# Patient Record
Sex: Female | Born: 1945 | Race: Black or African American | Hispanic: No | Marital: Married | Smoking: Current every day smoker
Health system: Southern US, Community
[De-identification: ages and names within clinical notes are randomized; demographics above are authoritative.]

## PROBLEM LIST (undated history)

## (undated) DIAGNOSIS — E119 Type 2 diabetes mellitus without complications: Secondary | ICD-10-CM

## (undated) DIAGNOSIS — I10 Essential (primary) hypertension: Secondary | ICD-10-CM

---

## 1998-08-30 ENCOUNTER — Emergency Department (HOSPITAL_COMMUNITY): Admission: EM | Admit: 1998-08-30 | Discharge: 1998-08-30 | Payer: Self-pay | Admitting: Emergency Medicine

## 1999-03-03 ENCOUNTER — Encounter: Payer: Self-pay | Admitting: Emergency Medicine

## 1999-03-03 ENCOUNTER — Emergency Department (HOSPITAL_COMMUNITY): Admission: EM | Admit: 1999-03-03 | Discharge: 1999-03-03 | Payer: Self-pay | Admitting: Emergency Medicine

## 1999-03-24 ENCOUNTER — Emergency Department (HOSPITAL_COMMUNITY): Admission: EM | Admit: 1999-03-24 | Discharge: 1999-03-24 | Payer: Self-pay | Admitting: *Deleted

## 1999-11-04 ENCOUNTER — Ambulatory Visit (HOSPITAL_COMMUNITY)
Admission: RE | Admit: 1999-11-04 | Discharge: 1999-11-04 | Payer: Self-pay | Admitting: Physical Medicine and Rehabilitation

## 1999-11-04 ENCOUNTER — Encounter: Payer: Self-pay | Admitting: Physical Medicine and Rehabilitation

## 2001-12-15 ENCOUNTER — Emergency Department (HOSPITAL_COMMUNITY): Admission: EM | Admit: 2001-12-15 | Discharge: 2001-12-15 | Payer: Self-pay | Admitting: Emergency Medicine

## 2002-11-13 ENCOUNTER — Emergency Department (HOSPITAL_COMMUNITY): Admission: EM | Admit: 2002-11-13 | Discharge: 2002-11-13 | Payer: Self-pay | Admitting: Emergency Medicine

## 2002-11-13 ENCOUNTER — Encounter: Payer: Self-pay | Admitting: Emergency Medicine

## 2003-01-07 ENCOUNTER — Emergency Department (HOSPITAL_COMMUNITY): Admission: EM | Admit: 2003-01-07 | Discharge: 2003-01-07 | Payer: Self-pay | Admitting: Emergency Medicine

## 2003-01-07 ENCOUNTER — Encounter: Payer: Self-pay | Admitting: Emergency Medicine

## 2004-05-06 ENCOUNTER — Emergency Department (HOSPITAL_COMMUNITY): Admission: EM | Admit: 2004-05-06 | Discharge: 2004-05-06 | Payer: Self-pay | Admitting: Emergency Medicine

## 2009-05-24 ENCOUNTER — Emergency Department (HOSPITAL_COMMUNITY): Admission: EM | Admit: 2009-05-24 | Discharge: 2009-05-24 | Payer: Self-pay | Admitting: Family Medicine

## 2010-12-07 ENCOUNTER — Emergency Department (HOSPITAL_COMMUNITY)
Admission: EM | Admit: 2010-12-07 | Discharge: 2010-12-08 | Disposition: A | Discharge status: Home / Self Care | Payer: Self-pay | Attending: Emergency Medicine | Admitting: Emergency Medicine

## 2010-12-07 DIAGNOSIS — R0602 Shortness of breath: Secondary | ICD-10-CM | POA: Insufficient documentation

## 2010-12-07 DIAGNOSIS — R61 Generalized hyperhidrosis: Secondary | ICD-10-CM | POA: Insufficient documentation

## 2010-12-07 DIAGNOSIS — R42 Dizziness and giddiness: Secondary | ICD-10-CM | POA: Insufficient documentation

## 2010-12-07 DIAGNOSIS — F17200 Nicotine dependence, unspecified, uncomplicated: Secondary | ICD-10-CM | POA: Insufficient documentation

## 2010-12-07 DIAGNOSIS — R0789 Other chest pain: Secondary | ICD-10-CM | POA: Insufficient documentation

## 2010-12-07 DIAGNOSIS — R110 Nausea: Secondary | ICD-10-CM | POA: Insufficient documentation

## 2010-12-08 ENCOUNTER — Emergency Department (HOSPITAL_COMMUNITY): Payer: Self-pay

## 2010-12-08 LAB — CBC
HCT: 40.1 % (ref 36.0–46.0)
Hemoglobin: 14.3 g/dL (ref 12.0–15.0)
MCV: 88.1 fL (ref 78.0–100.0)
RBC: 4.55 MIL/uL (ref 3.87–5.11)
WBC: 7.6 10*3/uL (ref 4.0–10.5)

## 2010-12-08 LAB — URINALYSIS, ROUTINE W REFLEX MICROSCOPIC
Glucose, UA: NEGATIVE mg/dL
Hgb urine dipstick: NEGATIVE
Ketones, ur: 15 mg/dL — AB
Protein, ur: NEGATIVE mg/dL
pH: 5 (ref 5.0–8.0)

## 2010-12-08 LAB — BASIC METABOLIC PANEL
BUN: 13 mg/dL (ref 6–23)
Chloride: 108 mEq/L (ref 96–112)
GFR calc non Af Amer: >60 mL/min (ref >60)
Glucose, Bld: 126 mg/dL — ABNORMAL HIGH (ref 70–99)
Potassium: 3.4 mEq/L — ABNORMAL LOW (ref 3.5–5.1)
Sodium: 143 mEq/L (ref 135–145)

## 2010-12-18 ENCOUNTER — Encounter: Payer: Self-pay | Admitting: Cardiology

## 2010-12-23 ENCOUNTER — Encounter: Payer: Self-pay | Admitting: Cardiology

## 2010-12-23 ENCOUNTER — Ambulatory Visit (INDEPENDENT_AMBULATORY_CARE_PROVIDER_SITE_OTHER): Payer: Medicare Other | Admitting: Cardiology

## 2010-12-23 VITALS — BP 154/76 | HR 90 | Resp 12 | Ht 61.0 in | Wt 149.0 lb

## 2010-12-23 DIAGNOSIS — I10 Essential (primary) hypertension: Secondary | ICD-10-CM

## 2010-12-23 DIAGNOSIS — R079 Chest pain, unspecified: Secondary | ICD-10-CM | POA: Insufficient documentation

## 2010-12-23 DIAGNOSIS — R072 Precordial pain: Secondary | ICD-10-CM

## 2010-12-23 DIAGNOSIS — F17200 Nicotine dependence, unspecified, uncomplicated: Secondary | ICD-10-CM

## 2010-12-23 DIAGNOSIS — Z720 Tobacco use: Secondary | ICD-10-CM

## 2010-12-23 NOTE — Assessment & Plan Note (Signed)
Patient counseled on discontinuing. 

## 2010-12-23 NOTE — Patient Instructions (Signed)
Your physician recommends that you schedule a follow-up appointment as needed with Dr. Jens Som Your physician has requested that you have a stress echocardiogram. For further information please visit [email or web address] Please follow instruction sheet as given.

## 2010-12-23 NOTE — Progress Notes (Signed)
HPI: 65 yo female with no prior cardiac history for evaluation of chest pain and dyspnea. Seen in the emergency room on Dec 08, 2010 with chest pain. Chest x-ray negative. Hemoglobin normal. No cardiac markers drawn. Patient states that on the day above she was working and developed sudden onset of weakness and diaphoresis. There was no shortness of breath or palpitations. There was no syncope. She sat down and developed a "deep ache" in her left chest. The pain did not radiate. It was not pleuritic or positional. She was given aspirin and then had nausea and vomiting. The pain resolved after 5-10 minutes. She has had no symptoms since then. She denies dyspnea on exertion, orthopnea, PND, pedal edema, exertional chest pain or claudication. Because of the above we were asked to further evaluate.  Current Outpatient Prescriptions  Medication Sig Dispense Refill  . Multiple Vitamin (MULTIVITAMIN) capsule Take 1 capsule by mouth every other day.          No Known Allergies  No past medical history on file.  Past Surgical History  Procedure Date  . Cesarean section     History   Social History  . Marital Status: Married    Spouse Name: N/A    Number of Children: 6  . Years of Education: N/A   Occupational History  .  [employer]   Social History Main Topics  . Smoking status: Current Everyday Smoker    Types: Cigarettes  . Smokeless tobacco: Not on file  . Alcohol Use: Not on file  . Drug Use: Not on file  . Sexually Active: Not on file   Other Topics Concern  . Not on file   Social History Narrative  . No narrative on file    Family History  Problem Relation Age of Onset  . Coronary artery disease Mother     Died at age 37 of MI  . Coronary artery disease Father     Age 65    ROS: no fevers or chills, productive cough, hemoptysis, dysphasia, odynophagia, melena, hematochezia, dysuria, hematuria, rash, seizure activity, orthopnea, PND, pedal edema,  claudication. Remaining systems are negative.  Physical Exam: General:  Well developed/well nourished in NAD Skin warm/dry Patient not depressed No peripheral clubbing Back-normal HEENT-normal/normal eyelids Neck supple/normal carotid upstroke bilaterally; no bruits; no JVD; no thyromegaly chest - CTA/ normal expansion CV - RRR/normal S1 and S2; no murmurs, rubs or gallops;  PMI nondisplaced Abdomen -NT/ND, no HSM, no mass, + bowel sounds, no bruit 2+ femoral pulses, no bruits Ext-no edema, chords, 2+ DP Neuro-grossly nonfocal  ECG 12/08/10 - Sinus rhythm at a rate of 63. No significant ST changes.

## 2010-12-23 NOTE — Assessment & Plan Note (Signed)
Blood pressure is mildly elevated today. He does not carry the diagnosis of hypertension. I have asked her to followup with her primary care physician and she may require therapy in the future.

## 2010-12-23 NOTE — Assessment & Plan Note (Signed)
Symptoms atypical. We'll schedule stress echocardiogram for risk stratification.

## 2011-01-08 ENCOUNTER — Ambulatory Visit (HOSPITAL_BASED_OUTPATIENT_CLINIC_OR_DEPARTMENT_OTHER): Payer: Medicare Other | Admitting: Radiology

## 2011-01-08 ENCOUNTER — Ambulatory Visit (HOSPITAL_COMMUNITY): Payer: Medicare Other | Attending: Cardiology | Admitting: Radiology

## 2011-01-08 DIAGNOSIS — R0989 Other specified symptoms and signs involving the circulatory and respiratory systems: Secondary | ICD-10-CM

## 2011-01-08 DIAGNOSIS — R072 Precordial pain: Secondary | ICD-10-CM

## 2011-01-08 DIAGNOSIS — R5381 Other malaise: Secondary | ICD-10-CM | POA: Insufficient documentation

## 2011-01-08 DIAGNOSIS — F17200 Nicotine dependence, unspecified, uncomplicated: Secondary | ICD-10-CM | POA: Insufficient documentation

## 2011-01-08 DIAGNOSIS — R5383 Other fatigue: Secondary | ICD-10-CM | POA: Insufficient documentation

## 2011-01-08 DIAGNOSIS — I10 Essential (primary) hypertension: Secondary | ICD-10-CM | POA: Insufficient documentation

## 2011-01-12 ENCOUNTER — Telehealth: Payer: Self-pay | Admitting: Cardiology

## 2011-01-12 NOTE — Telephone Encounter (Signed)
Pt would like test results from last week she was told to call today to get results

## 2011-01-12 NOTE — Telephone Encounter (Signed)
Spoke with pt, aware of test results Carol Ortiz  

## 2011-09-02 ENCOUNTER — Encounter (HOSPITAL_COMMUNITY): Payer: Self-pay | Admitting: *Deleted

## 2011-09-02 ENCOUNTER — Emergency Department (INDEPENDENT_AMBULATORY_CARE_PROVIDER_SITE_OTHER)
Admission: EM | Admit: 2011-09-02 | Discharge: 2011-09-02 | Disposition: A | Discharge status: Home / Self Care | Payer: Medicare Other | Source: Home / Self Care | Attending: Family Medicine | Admitting: Family Medicine

## 2011-09-02 DIAGNOSIS — M25569 Pain in unspecified knee: Secondary | ICD-10-CM

## 2011-09-02 NOTE — ED Provider Notes (Signed)
History     CSN: [id number]  Arrival date & time 09/02/11  1318   First MD Initiated Contact with Patient 09/02/11 1423      Chief Complaint  Patient presents with  . Leg Pain    (Consider location/radiation/quality/duration/timing/severity/associated sxs/prior treatment) HPI Comments: The patient reports having left post knee pain x 1 day. No known injury. No repetative motion. No locking or giving way. Pain increases with weight bearing. States she had been sitting at a computer and had pain when she tried to stand. No tx pta.   The history is provided by the patient.    History reviewed. No pertinent past medical history.  Past Surgical History  Procedure Date  . Cesarean section     Family History  Problem Relation Age of Onset  . Coronary artery disease Mother     Died at age 49 of MI  . Coronary artery disease Father     Age 68    History  Substance Use Topics  . Smoking status: Current Everyday Smoker    Types: Cigarettes  . Smokeless tobacco: Not on file  . Alcohol Use: Not on file    OB History    Grav Para Term Preterm Abortions TAB SAB Ect Mult Living                  Review of Systems  Constitutional: Negative.   HENT: Negative.   Respiratory: Negative.   Cardiovascular: Negative.   Gastrointestinal: Negative.   Genitourinary: Negative.     Allergies  Review of patient's allergies indicates no known allergies.  Home Medications   Current Outpatient Rx  Name Route Sig Dispense Refill  . MULTIVITAMINS PO CAPS Oral Take 1 capsule by mouth every other day.        BP 134/80  Pulse 84  Temp(Src) 98.5 F (36.9 C) (Oral)  Resp 18  SpO2 95%  Physical Exam  Nursing note and vitals reviewed. Constitutional: She appears well-developed and well-nourished. No distress.  Cardiovascular: Normal rate.   Pulmonary/Chest: Breath sounds normal.  Musculoskeletal:       Evaluation of the left knee reveals minimal swelling if any. No increased  warmth or erythema. No skin changes. No joint line or popliteal tenderness or swelling. rom intact. Pain with weight bearing. N/v intact distally. No calf swelling or tenderness. No evidence for dvt.     ED Course  Procedures (including critical care time)  Labs Reviewed - No data to display No results found.   1. Knee pain       MDM          Randa Spike, MD 09/02/11 [phone]

## 2011-09-02 NOTE — ED Notes (Signed)
Pt    Is  Awake  Alert  denys  Any  Chest pain or  Any  Shortness of  Breath      Skin is  Warm  Dry      She  Ambulated  To  Exam  Room

## 2011-09-02 NOTE — ED Notes (Signed)
Pt  Reports  Pain back of l  Leg   From  Thigh  Down to  Calf  She  denys  Any  Injury   Pain on  Weight  Bearing  She  Reports  Feels  Tight  Behind  l  Knee

## 2011-10-15 ENCOUNTER — Encounter (HOSPITAL_COMMUNITY): Payer: Self-pay

## 2011-10-15 ENCOUNTER — Emergency Department (HOSPITAL_COMMUNITY)
Admission: EM | Admit: 2011-10-15 | Discharge: 2011-10-15 | Disposition: A | Discharge status: Home / Self Care | Payer: Medicare Other | Source: Home / Self Care | Attending: Emergency Medicine | Admitting: Emergency Medicine

## 2011-10-15 DIAGNOSIS — M545 Low back pain, unspecified: Secondary | ICD-10-CM

## 2011-10-15 MED ORDER — CYCLOBENZAPRINE HCL 10 MG PO TABS: 10.0000 mg | ORAL_TABLET | Freq: Three times a day (TID) | ORAL | Status: AC | PRN

## 2011-10-15 MED ORDER — MELOXICAM 15 MG PO TABS: 15.0000 mg | ORAL_TABLET | Freq: Every day | ORAL | Status: AC

## 2011-10-15 NOTE — Discharge Instructions (Signed)
Followup with Dr. Ilean Skill as scheduled. Stop the ibuprofen, and start taking the meloxicam instead. You may also take 1 g of Tylenol up to 4 times a day as needed for pain. This in combination with the meloxicam is an effective combination for pain. Use the Flexeril as needed. Be cautious with this medication, as it may make you very sleepy, and increases her risk for fall. You also need to have your blood pressure rechecked by your primary care physician within a week. It was high on today's visit. It is important to keep your blood pressure under good control, as having a elevated for prolonged periods of time significantly increases your risk of stroke, heart attacks, kidney damage, eye damage, and other problems. Return immediately to the ER if you start having chest pain, headache, problems seeing, problems talking, problems walking, if you feel like you're about to pass out, if you do pass out, if you have a seizure, or for any other concerns.Marland Kitchen

## 2011-10-15 NOTE — ED Notes (Signed)
Pt reports she has been having pain in  her low back and into her left hip for past 2 weeks. Had been seen here about a month ago and was given a leg brace to wear, went to see Dr Ilean Skill, and has an appt to see him on Tuesday. States her back pain last night was very bad, off tonight, and is wanting something to help with the pain until she can see her MD next week; NAD at present

## 2011-10-15 NOTE — ED Provider Notes (Signed)
History     CSN: [id number]  Arrival date & time 10/15/11  1528   First MD Initiated Contact with Patient 10/15/11 1624      Chief Complaint  Patient presents with  . Back Pain    (Consider location/radiation/quality/duration/timing/severity/associated sxs/prior treatment) HPI Comments: Patient reports achy, intermittent lower back pain for approximately 2 weeks. Patient states that pain is located in her left hip and left lower back. States that the pain is worse with sitting for prolonged periods of time, and from going from lying to standing. Patient has been taking 600 mg of ibuprofen with mild to moderate relief. She states that the pain started after wearing a knee immobilizer on the left side for several weeks because of knee pain. Her knee pain has since resolved. Patient denies fevers, h/o of recent or remote trauma to her back or hip, neurological deficits, urinary complaints, abdominal pain, bladder/ bowel incontinence, h/o CA, unexplained weight loss, pain worse at night,  h/o prolonged steroid use, h/o osteopenia, h/o IVDU.      Patient is a 66 y.o. female presenting with back pain. No language interpreter was used.  Back Pain  The current episode started more than 1 week ago. The problem occurs daily. The problem has not changed since onset.The pain is present in the lumbar spine. The quality of the pain is described as aching. The symptoms are aggravated by bending and certain positions. The pain is worse during the day. Stiffness is present in the morning. Pertinent negatives include no chest pain, no fever, no abdominal pain and no weakness. She has tried NSAIDs for the symptoms. The treatment provided mild relief.    Past Medical History  Diagnosis Date  . Chest pain     normal echo 2012    Past Surgical History  Procedure Date  . Cesarean section     Family History  Problem Relation Age of Onset  . Coronary artery disease Mother     Died at age 67 of MI  .  Coronary artery disease Father     Age 57    History  Substance Use Topics  . Smoking status: Current Everyday Smoker    Types: Cigarettes  . Smokeless tobacco: Not on file  . Alcohol Use: No    OB History    Grav Para Term Preterm Abortions TAB SAB Ect Mult Living                  Review of Systems  Constitutional: Negative for fever.  Respiratory: Negative for shortness of breath.   Cardiovascular: Negative for chest pain.  Gastrointestinal: Negative for nausea and abdominal pain.  Genitourinary: Negative for flank pain and decreased urine volume.  Musculoskeletal: Positive for back pain and arthralgias. Negative for joint swelling.  Skin: Negative for rash.  Neurological: Negative for weakness.    Allergies  Review of patient's allergies indicates no known allergies.  Home Medications   Current Outpatient Rx  Name Route Sig Dispense Refill  . CYCLOBENZAPRINE HCL 10 MG PO TABS Oral Take 1 tablet (10 mg total) by mouth 3 (three) times daily as needed for muscle spasms. 20 tablet 0  . MELOXICAM 15 MG PO TABS Oral Take 1 tablet (15 mg total) by mouth daily. 14 tablet 0  . MULTIVITAMINS PO CAPS Oral Take 1 capsule by mouth every other day.        BP 161/87  Pulse 64  Temp(Src) 98.4 F (36.9 C) (Oral)  Resp 18  SpO2 100% Filed Vitals:   10/15/11 1730  BP: 160/78  Pulse:   Temp:   Resp:      Physical Exam  Nursing note and vitals reviewed. Constitutional: She is oriented to person, place, and time. She appears well-developed and well-nourished. No distress.  HENT:  Head: Normocephalic and atraumatic.  Eyes: Conjunctivae and EOM are normal.  Neck: Normal range of motion.  Cardiovascular: Normal rate.   Pulmonary/Chest: Effort normal.  Abdominal: She exhibits no distension.  Musculoskeletal: Normal range of motion.       Back:       Bilateral lower extremities nontender, baseline ROM with intact PT pulses. No pain with PROM hips bilaterally. SLR neg  bilaterally. Sensation baseline light touch bilaterally for Pt, DTR's symmetric and intact bilaterally KJ. Motor symmetric bilateral 5/5 hip flexion, quadriceps, hamstrings, EHL, foot dorsiflexion, foot plantarflexion, has mild limp on the left side, but gait is steady. Left Knee exam within normal limits.   Neurological: She is alert and oriented to person, place, and time.  Skin: Skin is warm and dry.  Psychiatric: She has a normal mood and affect. Her behavior is normal. Judgment and thought content normal.    ED Course  Procedures (including critical care time)  Labs Reviewed - No data to display No results found.   1. Back pain, lumbosacral       MDM  Previous chart  reviewed. Pt seen at [facility] for left knee pain in February 2013, was given brace. Seen last year for CP, OP stress test was WNL. Was noted to have elevated BP during the stress test but was not tx'd for HTN. Was normotensive on last visit. Pt hypertensive today.  appears to be in pain. Pt denies any CNS type sx such as HA, visual changes, focal paresis, or new onset seizure activity. Pt denies any CV sx such as CP, dyspnea, palpitations, pedal edema, tearing pain radiating to back or abd. Pt denied any renal sx such as anuria or hematuria. Pt denies illicit drug use, most notably cocaine, or recent use of OTC medications such as nasal decongestants. Discussed with patient importance of having this rechecked at her primary care physician.    H&P most consistent with low back pain from altered gait. Patient states that she was walking around in the knee immobilizer for several weeks, and her pain started while doing this. She has an appointment with Dr. Orvan Falconer at [facility] in 4 days. Will send her home with a long acting anti-inflammatory, and Flexeril.  [place] narcotic database reviewed. Pt with no narcotic rx in past year.   Luiz Blare, MD 10/15/11 [phone]

## 2014-01-07 ENCOUNTER — Emergency Department (INDEPENDENT_AMBULATORY_CARE_PROVIDER_SITE_OTHER): Payer: Medicare Other

## 2014-01-07 ENCOUNTER — Emergency Department (HOSPITAL_COMMUNITY)
Admission: EM | Admit: 2014-01-07 | Discharge: 2014-01-07 | Disposition: A | Discharge status: Home / Self Care | Payer: Medicare Other | Source: Home / Self Care | Attending: Emergency Medicine | Admitting: Emergency Medicine

## 2014-01-07 ENCOUNTER — Encounter (HOSPITAL_COMMUNITY): Payer: Self-pay | Admitting: Emergency Medicine

## 2014-01-07 DIAGNOSIS — M1710 Unilateral primary osteoarthritis, unspecified knee: Secondary | ICD-10-CM

## 2014-01-07 DIAGNOSIS — IMO0002 Reserved for concepts with insufficient information to code with codable children: Secondary | ICD-10-CM

## 2014-01-07 DIAGNOSIS — M76899 Other specified enthesopathies of unspecified lower limb, excluding foot: Secondary | ICD-10-CM

## 2014-01-07 DIAGNOSIS — M1712 Unilateral primary osteoarthritis, left knee: Secondary | ICD-10-CM

## 2014-01-07 DIAGNOSIS — M7071 Other bursitis of hip, right hip: Secondary | ICD-10-CM

## 2014-01-07 MED ORDER — SALSALATE 750 MG PO TABS: 750.0000 mg | ORAL_TABLET | Freq: Two times a day (BID) | ORAL | Status: DC

## 2014-01-07 MED ORDER — HYDROCODONE-ACETAMINOPHEN 5-325 MG PO TABS
ORAL_TABLET | ORAL | Status: DC
Start: 2014-01-07 — End: 2017-09-09

## 2014-01-07 MED ORDER — IBUPROFEN 800 MG PO TABS
ORAL_TABLET | ORAL | Status: AC
  Filled 2014-01-07: qty 1

## 2014-01-07 MED ORDER — OMEPRAZOLE 20 MG PO CPDR: 20.0000 mg | DELAYED_RELEASE_CAPSULE | Freq: Every day | ORAL | Status: DC

## 2014-01-07 MED ORDER — IBUPROFEN 800 MG PO TABS
800.0000 mg | ORAL_TABLET | Freq: Once | ORAL | Status: AC
  Administered 2014-01-07: 800 mg via ORAL

## 2014-01-07 NOTE — ED Notes (Addendum)
Denies injury. Has pain in left knee and right hip. Prior visit to MD was told she has arthritis. Has an appointment to see Dr Zachery DauerBarnes tomorrow. Has used motrin for pain w minimal relief

## 2014-01-07 NOTE — ED Provider Notes (Signed)
Chief Complaint    Chief Complaint  Patient presents with  . 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 She is on her feet long hours every day. She's had a several year long history of recurring pain left knee. She saw her orthopedist for this. She was told she had a cartilage tear and a cyst. She was put in a brace and given Mobic. The knee has gotten better and worse. The pain is localized over the kneecap and in the popliteal fossa. Sometimes locks up and feels weak. She denies any catching or giving way. It hurts to walk or to go up and down steps.  Her new problem is right hip pain. This is been going on for the past week. Is worse when she lies on that side. There's been no injury or trauma. No pain radiating down the leg. No numbness, tingling, weakness, bladder or bowel dysfunction or saddle anesthesia. She's due to see her orthopedist tomorrow.  Review of Systems     Other than as noted above, the patient denies any of the following symptoms: Systemic:  No fevers or chills.   Musculoskeletal:  No joint pain, arthritis, back pain, or neck pain. Neurological:  No muscular weakness or paresthesias.  PMFSH    Past medical history, family history, social history, meds, and allergies were reviewed.    Physical Exam    Vital signs:  BP 155/83  Pulse 74  Temp(Src) 98.2 F (36.8 C) (Oral)  Resp 16  SpO2 96% Gen:  Alert and oriented times 3.  In no distress. Musculoskeletal: Exam of the right hip reveals pain to palpation over the greater trochanter. The hip has 90 of flexion. Beyond that it hurts. She has pain with internal and external rotation. Pearlean BrownieFaber and Fadir maneuvers are positive. Straight leg raising is negative. Exam of the knee reveals pain to palpation of the medial joint line and the popliteal fossa. The knee has a full range of motion with pain with flexion beyond 90. There is no crepitus.  Murray's sign was negative. Lachman's sign was negative, anterior drawer sign was negative. Varus and valgus stress were negative.  Otherwise, all joints had a full a ROM with no swelling, bruising or deformity.  No edema, pulses full. Extremities were warm and pink.  Capillary refill was brisk.  Skin:  Clear, warm and dry.  No rash. Neuro:  Alert and oriented times 3.  Muscle strength was normal.  Sensation was intact to light touch.    Radiology     Dg Hip Complete Right  01/07/2014   CLINICAL DATA:  Pain  EXAM: RIGHT HIP - COMPLETE 2+ VIEW  COMPARISON:  None.  FINDINGS: Frontal pelvis as well as frontal and lateral right hip images were obtained. There is no fracture or dislocation. Joint spaces appear intact. No erosive change. There is a probable bone island in the lateral left ischium.  IMPRESSION: No abnormality noted.   Electronically Signed   By: Bretta BangWilliam  Woodruff M.D.   On: 01/07/2014 13:31   Dg Knee Complete 4 Views Left  01/07/2014   CLINICAL DATA:  LEFT knee pain for years, no injury  EXAM: LEFT KNEE - COMPLETE 4+ VIEW  COMPARISON:  None  FINDINGS: Question mild osseous demineralization.  Medial compartment joint space narrowing and minimal spur formation, questionably lateral compartment as well.  No acute fracture, dislocation  or bone destruction.  No knee joint effusion or regional soft tissue abnormality.  IMPRESSION: Minimal degenerative changes.  No acute abnormalities.   Electronically Signed   By: Mark  BolesUlyses Southward M.D.   On: 01/07/2014 13:32   I reviewed the images independently and personally and concur with the radiologist's findings.  Course in Urgent [facility]   She was given a knee sleeve for the left knee.  Assessment    The primary encounter diagnosis was Osteoarthritis of left knee. A diagnosis of Bursitis of right hip was also pertinent to this visit.  Plan   1.  Meds:  The following meds were prescribed:   Discharge Medication List as of 01/07/2014  1:57 PM     START taking these medications   Details  HYDROcodone-acetaminophen (NORCO/VICODIN) 5-325 MG per tablet 1 to 2 tabs every 4 to 6 hours as needed for pain., Print    omeprazole (PRILOSEC) 20 MG capsule Take 1 capsule (20 mg total) by mouth daily., Starting 01/07/2014, Until Discontinued, Normal    salsalate (DISALCID) 750 MG tablet Take 1 tablet (750 mg total) by mouth 2 (two) times daily., Starting 01/07/2014, Until Discontinued, Normal        2.  Patient Education/Counseling:  The patient was given appropriate handouts, self care instructions, and instructed in symptomatic relief, including rest and activity, elevation, application of ice and compression.  Given exercises for the knee and the hip.  3.  Follow up:  The patient was told to follow up here if no better in 3 to 4 days, or sooner if becoming worse in any way, and given some red flag symptoms such as worsening pain or new neurological symptoms which would prompt immediate return.  Follow up with her orthopedist tomorrow.     Reuben Likesavid C Celsa Nordahl, MD 01/07/14 1600

## 2014-01-07 NOTE — Discharge Instructions (Signed)
Knee pain can be caused by many conditions:  Osteoarthritis, gout, bursitis, tendonitis, cartiledge damage, condromalacia patella, patellofemoral syndrome, and ligament sprain to name just a few.  Often some simple conservative measures can help alleviate the pain.  Do not do the following:  Avoid squatting and doing deep knee bends.  This puts too much of load on your cartiledges and tendons.  If you do a knee bend, go only half way down, flexing your knee no more than 90 degrees.  Do the following:  If you are overweight or obese, lose weight.  This makes for a lot less load on your knee joints.  If you use tobacco, quit.  Nicotine causes spasm of the small arteries, decreases blood flow, and impairs your body's normal ability to repair damage.  If your knee is acutely inflamed, use the principles of RICE (rest, ice, compression, and elevation).  Wearing a knee brace can help.  These are usually made of neoprene and can be purchased over the counter at the drug store.  Use of over the counter pain meds can be of help.  Tylenol (or acetaminophen) is the safest to use.  It often helps to take this regularly.  You can take up to 2 325 mg tablets 5 times daily, but it best to start out much lower that that, perhaps 2 325 mg tablets twice daily, then increase from there. People who are on the blood thinner warfarin have to be careful about taking high doses of Tylenol.  For people who are able to tolerate them, ibuprofen and naproxyn can also help with the pain.  You should discuss these agents with your physician before taking them.  People with chronic kidney disease, hypertension, peptic ulcer disease, and reflux can suffer adverse side effects. They should not be taken with warfarin. The maximum dosage of ibuprofen is 800 mg 3 times daily with meals.  The maximum dosage of naprosyn is 2 and 1/2 tablets twice daily with food, but again, start out low and gradually increase the dose until adequate  pain relief is achieved. Ibuprofen and naprosyn should always be taken with food.  People with cartiledge injury or osteoarthritis may find glucosamine to be helpful.  This is an over-the-counter supplement that helps nourish and repair cartiledge.  The dose is 500 mg 3 times daily or 1500 mg taken in a single dose. This can take several months to work and it doesn't always work.    For people with knee pain on just one side, use of a cane held in the hand on the same side as the knee pain takes some of the stress off the knee joint and can make a big difference in knee pain.  Wearing good shoes with adequate arch support is essential.  Regular exercise is of utmost importance.  Swimming, water aerobics, or use of an elliptical exerciser put the least stress on the knees of any exercise.  Finally doing the exercises below can be very helpful.  They tend to strengthen the muscles around the knee and provide extra support and stability.  Try to do them twice a day followed by ice for 10 minutes.        Most hip pain is caused by osteoarthritis, bursitis, or tendonitis.  Simple measures plus regular gentle exercises can help.  Do not do the following:  Avoid squatting and doing deep knee bends.  This puts too much of load on your cartiledges and tendons.  If you do a  knee bend, go only half way down, flexing your knee no more than 90 degrees.  Avoid sleeping on the side that hurts.  Do the following:  If you are overweight or obese, lose weight.  This makes for a lot less load on your hip joints.  If you use tobacco, quit.  Nicotine causes spasm of the small arteries, decreases blood flow, and impairs your body's normal ability to repair damage.  If your hip is acutely inflamed, use the principles of RICE (rest, ice, compression, and elevation).  Use of over the counter pain meds can be of help.  Tylenol (or acetaminophen) is the safest to use.  It often helps to take this regularly.   You can take up to 2 325 mg tablets 5 times daily, but it best to start out much lower that that, perhaps 2 325 mg tablets twice daily, then increase from there. People who are on the blood thinner warfarin have to be careful about taking high doses of Tylenol.  For people who are able to tolerate them, ibuprofen and naproxyn can also help with the pain.  You should discuss these agents with your physician before taking them.  People with chronic kidney disease, hypertension, peptic ulcer disease, and reflux can suffer adverse side effects. They should not be taken with warfarin. The maximum dosage of ibuprofen is 800 mg 3 times daily with meals.  The maximum dosage of naprosyn is 2 and 1/2 tablets twice daily with food, but again, start out low and gradually increase the dose until adequate pain relief is achieved. Ibuprofen and naprosyn should always be taken with food.  People with cartiledge injury or osteoarthritis may find glucosamine to be helpful.  This is an over-the-counter supplement that helps nourish and repair cartiledge.  The dose is 500 mg 3 times daily or 1500 mg taken in a single dose. This can take several months to work and it doesn't always work.    For people with hip pain on just one side, use of a cane held in the hand on the same side as the hip pain takes some of the stress off the hip joint and can make a big difference in hip pain.  Wearing good shoes with adequate arch support is essential. Use of an orthotic insert can be very helpful.  These can be purchased at a shoe store or inexpensive inserts can be gotten at the drug store.  Regular exercise is of utmost importance.  Swimming, water aerobics, low impact aerobics, yoga or tai chi are helpful.  Use of an elliptical exerciser put the least stress on the hips of any type of exercise machine.  Finally doing the exercises below can be very helpful. Try to do them twice a day followed by ice for 10 minutes.

## 2014-01-22 ENCOUNTER — Encounter: Payer: Self-pay | Admitting: Cardiology

## 2017-09-09 ENCOUNTER — Other Ambulatory Visit: Payer: Self-pay

## 2017-09-09 ENCOUNTER — Encounter (HOSPITAL_COMMUNITY): Payer: Self-pay | Admitting: Emergency Medicine

## 2017-09-09 ENCOUNTER — Ambulatory Visit (HOSPITAL_COMMUNITY)
Admission: EM | Admit: 2017-09-09 | Discharge: 2017-09-09 | Disposition: A | Discharge status: Home / Self Care | Payer: Medicare Other | Attending: Family Medicine | Admitting: Family Medicine

## 2017-09-09 DIAGNOSIS — H6122 Impacted cerumen, left ear: Secondary | ICD-10-CM

## 2017-09-09 NOTE — ED Triage Notes (Signed)
Pt c/o dull L ear pain x3 days. Pt unable to hear out of L ear, feels like its clogged up.

## 2017-09-09 NOTE — ED Provider Notes (Signed)
[facility]   [id number] 09/09/17 Arrival Time: 1146   SUBJECTIVE:  Carol Ortiz is a 71 y.o. female who presents to the urgent care with complaint of dull L ear pain x3 days. Pt unable to hear out of L ear, feels like its clogged up.   Past Medical History:  Diagnosis Date  . Chest pain    normal echo 2012   Family History  Problem Relation Age of Onset  . Coronary artery disease Mother        Died at age 78 of MI  . Coronary artery disease Father        Age 53   Social History   Socioeconomic History  . Marital status: Married    Spouse name: Not on file  . Number of children: 6  . Years of education: Not on file  . Highest education level: Not on file  Social Needs  . Financial resource strain: Not on file  . Food insecurity - worry: Not on file  . Food insecurity - inability: Not on file  . Transportation needs - medical: Not on file  . Transportation needs - non-medical: Not on file  Occupational History    Employer: [employer]  Tobacco Use  . Smoking status: Current Every Day Smoker    Types: Cigarettes  Substance and Sexual Activity  . Alcohol use: No  . Drug use: No  . Sexual activity: Not on file  Other Topics Concern  . Not on file  Social History Narrative  . Not on file   No outpatient medications have been marked as taking for the 09/09/17 encounter (Hospital Encounter).   No Known Allergies    ROS: As per HPI, remainder of ROS negative.   OBJECTIVE:   Vitals:   09/09/17 1249  BP: (!) 161/110  Pulse: 83  Resp: 14  Temp: 98.4 F (36.9 C)  SpO2: 97%     General appearance: alert; no distress Eyes: PERRL; EOMI; conjunctiva normal HENT: normocephalic; atraumatic; left ear cerumen impaction, external ears normal without trauma; nasal mucosa normal; oral mucosa normal Neck: supple Back: no CVA tenderness Extremities: no cyanosis or edema; symmetrical with no gross deformities Skin: warm and  dry Neurologic: normal gait; grossly normal Psychological: alert and cooperative; normal mood and affect      Labs:  Results for orders placed or performed during the hospital encounter of 12/07/10  CBC  Result Value Ref Range   WBC 7.6 4.0 - 10.5 K/uL   RBC 4.55 3.87 - 5.11 MIL/uL   Hemoglobin 14.3 12.0 - 15.0 g/dL   HCT 40.1 36.0 - 46.0 %   MCV 88.1 78.0 - 100.0 fL   MCH 31.4 26.0 - 34.0 pg   MCHC 35.7 30.0 - 36.0 g/dL   RDW 12.6 11.5 - 15.5 %   Platelets 278 150 - 400 K/uL  Basic metabolic panel  Result Value Ref Range   Sodium 143 135 - 145 mEq/L   Potassium 3.4 (L) 3.5 - 5.1 mEq/L   Chloride 108 96 - 112 mEq/L   CO2 25 19 - 32 mEq/L   Glucose, Bld 126 (H) 70 - 99 mg/dL   BUN 13 6 - 23 mg/dL   Creatinine, Ser 0.60 0.4 - 1.2 mg/dL   Calcium 9.5 8.4 - 10.5 mg/dL   GFR calc non Af Amer >60 >60 mL/min   GFR calc Af Amer  >60 mL/min    >60          The eGFR has been calculated using the MDRD equation. This calculation has not been validated in all clinical situations. eGFR's persistently <60 mL/min signify possible Chronic Kidney Disease.  Urinalysis, Routine w reflex microscopic  Result Value Ref Range   Color, Urine YELLOW YELLOW   APPearance CLEAR CLEAR   Specific Gravity, Urine 1.024 1.005 - 1.030   pH 5.0 5.0 - 8.0   Glucose, UA NEGATIVE NEGATIVE mg/dL   Hgb urine dipstick NEGATIVE NEGATIVE   Bilirubin Urine SMALL (A) NEGATIVE   Ketones, ur 15 (A) NEGATIVE mg/dL   Protein, ur NEGATIVE NEGATIVE mg/dL   Urobilinogen, UA 1.0 0.0 - 1.0 mg/dL   Nitrite NEGATIVE NEGATIVE   Leukocytes, UA  NEGATIVE    NEGATIVE MICROSCOPIC NOT DONE ON URINES WITH NEGATIVE PROTEIN, BLOOD, LEUKOCYTES, NITRITE, OR GLUCOSE <1000 mg/dL.    Labs Reviewed - No data to display  No results found.     ASSESSMENT & PLAN:  1. Impacted cerumen of left ear     No orders of the defined types were placed in this encounter.   Reviewed expectations re: course of current medical  issues. Questions answered. Outlined signs and symptoms indicating need for more acute intervention. Patient verbalized understanding. After Visit Summary given.    Procedures:  Left ear irrigation was successful and TM is normal      Robyn Haber, MD 09/09/17 1400

## 2017-09-09 NOTE — Discharge Instructions (Signed)
Cerumenex and Debrox are two brands of drops which help loosen and remove wax.  You could put a drop in once a month to prevent accumulation of the wax  These two medications are over the counter

## 2017-10-29 ENCOUNTER — Other Ambulatory Visit: Payer: Self-pay

## 2017-10-29 ENCOUNTER — Ambulatory Visit (HOSPITAL_COMMUNITY)
Admission: EM | Admit: 2017-10-29 | Discharge: 2017-10-29 | Disposition: A | Discharge status: Home / Self Care | Payer: Medicare Other | Attending: Internal Medicine | Admitting: Internal Medicine

## 2017-10-29 ENCOUNTER — Encounter (HOSPITAL_COMMUNITY): Payer: Self-pay | Admitting: *Deleted

## 2017-10-29 DIAGNOSIS — R030 Elevated blood-pressure reading, without diagnosis of hypertension: Secondary | ICD-10-CM | POA: Diagnosis not present

## 2017-10-29 DIAGNOSIS — Z8679 Personal history of other diseases of the circulatory system: Secondary | ICD-10-CM | POA: Diagnosis not present

## 2017-10-29 NOTE — Discharge Instructions (Signed)
May keep log of blood pressure checks a few times a week, try to take at the same time of the day.  Please establish with a primary care provider for further management. If develop chest pain, headache, vision change, shortness of breath or other concern with elevated blood pressure please return to be seen.

## 2017-10-29 NOTE — ED Provider Notes (Signed)
[facility]    CSN: [id number] Arrival date & time: 10/29/17  1202     History   Chief Complaint Chief Complaint  Patient presents with  . Hypertension    HPI Carol Ortiz is a 72 y.o. female.   Carol Ortiz presents with concerns about her blood pressure. States she has been in urgent cares a few times over the past few weeks and had elevated blood pressure. Was at an urgent care just a few days ago to have work papers filled out and her bp was around 173/93 she thought. She is not on any blood pressure medications, and has not been in the past. She states she has occasional headaches. Denies any symptoms currently including headache, shortness of breath , leg swelling, vision changes. She quit smoking approximately 3 years ago. She has not followed regularly with a PCP and currently does not have one. Takes multivitamins daily otherwise no other medications. No other known medical history.    ROS per HPI.      Past Medical History:  Diagnosis Date  . Chest pain    normal echo 2012    Patient Active Problem List   Diagnosis Date Noted  . Chest pain 12/23/2010  . Tobacco abuse 12/23/2010  . Hypertension 12/23/2010    Past Surgical History:  Procedure Laterality Date  . CESAREAN SECTION      OB History   None      Home Medications    Prior to Admission medications   Medication Sig Start Date End Date Taking? Authorizing Provider  Multiple Vitamin (MULTIVITAMIN) capsule Take 1 capsule by mouth every other day.      [provider]    Family History Family History  Problem Relation Age of Onset  . Coronary artery disease Mother        Died at age 57 of MI  . Coronary artery disease Father        Age 48    Social History Social History   Tobacco Use  . Smoking status: Current Every Day Smoker    Types: Cigarettes  . Smokeless tobacco: Never Used  Substance Use Topics  . Alcohol use: No  . Drug use: No     Allergies   Patient  has no known allergies.   Review of Systems Review of Systems   Physical Exam Triage Vital Signs ED Triage Vitals  Enc Vitals Group     BP 10/29/17 1224 119/79     Pulse Rate 10/29/17 1224 67     Resp 10/29/17 1224 16     Temp 10/29/17 1224 98.5 F (36.9 C)     Temp Source 10/29/17 1224 Oral     SpO2 10/29/17 1224 97 %     Weight --      Height --      Head Circumference --      Peak Flow --      Pain Score 10/29/17 1242 0     Pain Loc --      Pain Edu? --      Excl. in GC? --    No data found.  Updated Vital Signs BP 135/62 (BP Location: Right Arm)   Pulse 63   Temp 98.5 F (36.9 C) (Oral)   Resp 16   SpO2 97%   Visual Acuity Right Eye Distance:   Left Eye Distance:   Bilateral Distance:    Right Eye Near:   Left Eye Near:    Bilateral  Near:     Physical Exam  Constitutional: She is oriented to person, place, and time. She appears well-developed and well-nourished. No distress.  Cardiovascular: Normal rate, regular rhythm, normal heart sounds and normal pulses.  Pulmonary/Chest: Effort normal and breath sounds normal.  Neurological: She is alert and oriented to person, place, and time.  Skin: Skin is warm and dry.     UC Treatments / Results  Labs (all labs ordered are listed, but only abnormal results are displayed) Labs Reviewed - No data to display  EKG None Radiology No results found.  Procedures Procedures (including critical care time)  Medications Ordered in UC Medications - No data to display   Initial Impression / Assessment and Plan / UC Course  I have reviewed the triage vital signs and the nursing notes.  Pertinent labs & imaging results that were available during my care of the patient were reviewed by me and considered in my medical decision making (see chart for details).     BP wnl today. Without acute symptoms or findings on exam. Patient endorses she has been under increased stress lately. Discussed other ways to manage  BP including diet and exercise. Encouraged patient to establish with a PCP for recheck and manage bp. Patient verbalized understanding and agreeable to plan.  Return precautions provided.   Final Clinical Impressions(s) / UC Diagnoses   Final diagnoses:  History of hypertension    ED Discharge Orders    None       Controlled Substance Prescriptions [place] Controlled Substance Registry consulted? Not Applicable   Georgetta HaberBurky, Natalie B, NP 10/29/17 1301

## 2017-10-29 NOTE — ED Triage Notes (Signed)
Patient is here to check why her blood pressure is elevated when she comes into urgent care, per pt when she came into office on Thursday for her ears to be cleaned and her BP was elevated.

## 2017-11-09 ENCOUNTER — Ambulatory Visit (HOSPITAL_COMMUNITY)
Admission: EM | Admit: 2017-11-09 | Discharge: 2017-11-09 | Disposition: A | Discharge status: Home / Self Care | Payer: Medicare Other | Attending: Family Medicine | Admitting: Family Medicine

## 2017-11-09 ENCOUNTER — Encounter (HOSPITAL_COMMUNITY): Payer: Self-pay | Admitting: Emergency Medicine

## 2017-11-09 DIAGNOSIS — R030 Elevated blood-pressure reading, without diagnosis of hypertension: Secondary | ICD-10-CM

## 2017-11-09 DIAGNOSIS — I10 Essential (primary) hypertension: Secondary | ICD-10-CM | POA: Diagnosis not present

## 2017-11-09 DIAGNOSIS — R42 Dizziness and giddiness: Secondary | ICD-10-CM

## 2017-11-09 MED ORDER — AMLODIPINE BESYLATE 5 MG PO TABS: 5.0000 mg | ORAL_TABLET | Freq: Every day | ORAL | 0 refills | Status: DC

## 2017-11-09 NOTE — ED Provider Notes (Signed)
  MRN: [id number] DOB: [birth year]  Subjective:   Varney Ortiz L Ortiz is a [age] y.o. female presenting for 1 day history of wooziness.  Patient reported that her daughter checked her blood pressure and it was in the 190s.  She admits having a history of high blood pressure but is not on any medications for this. Denies dizziness, chronic headache, blurred vision, chest pain, shortness of breath, heart racing, palpitations, nausea, vomiting, abdominal pain, hematuria, lower leg swelling.  Denies smoking cigarettes, alcohol use.  She is not currently taking any medications and has No Known Allergies.  Patient had a normal echo in 2012.  She does not have a primary care doctor at the moment.   Past Medical History:  Diagnosis Date  . Chest pain    normal echo 2012     Past Surgical History:  Procedure Laterality Date  . CESAREAN SECTION      Objective:   Vitals: BP (!) 149/83 (BP Location: Left Arm)   Pulse 64   Temp 98.1 F (36.7 C) (Oral)   Resp 18   SpO2 96%   BP Readings from Last 3 Encounters:  11/09/17 (!) 149/83  10/29/17 135/62  09/09/17 (!) 166/80    Physical Exam  Constitutional: She is oriented to person, place, and time. She appears well-developed and well-nourished.  HENT:  Mouth/Throat: Oropharynx is clear and moist.  Eyes: Pupils are equal, round, and reactive to light. EOM are normal. Right eye exhibits no discharge. Left eye exhibits no discharge. No scleral icterus.  Neck: Normal range of motion. Neck supple. No thyromegaly present.  Cardiovascular: Normal rate, regular rhythm and intact distal pulses. Exam reveals no gallop and no friction rub.  No murmur heard. Pulmonary/Chest: No respiratory distress. She has no wheezes. She has no rales.  Abdominal: Soft. Bowel sounds are normal. She exhibits no distension and no mass. There is no tenderness. There is no rebound and no guarding.  Lymphadenopathy:    She has no cervical adenopathy.  Neurological: She is alert and  oriented to person, place, and time. She displays normal reflexes. No cranial nerve deficit.  Speech is intact.  Patient has negative Romberg and pronator drift.  Skin: Skin is warm and dry.  Psychiatric: She has a normal mood and affect.   Assessment and Plan :   Essential hypertension  Elevated blood pressure reading  Wooziness   We will have patient start amlodipine at 5 mg.  Patient is to set up primary care visit to establish care with PCP. Counseled patient on potential for adverse effects with medications prescribed today, patient verbalized understanding.  ER and return to clinic precautions discussed.   Wallis BambergMani, Shaman Muscarella, [place] 11/09/17 1456

## 2017-11-09 NOTE — ED Triage Notes (Signed)
Pt sts she feels like she needs BP meds; pt denies hx of htn

## 2017-11-09 NOTE — Discharge Instructions (Addendum)
Make an appointment with me at Primary Care at [facility]. The phone number is [phone]. The address is [address], [place], [place] [id number].

## 2018-02-04 ENCOUNTER — Other Ambulatory Visit: Payer: Self-pay | Admitting: Urgent Care

## 2018-02-06 NOTE — Telephone Encounter (Signed)
amlodipine refill Last Refill:11/09/17 # 90 Last OV: 11/09/17 PCP: Wallis BambergMario Mani PA Pharmacy:[facility] [address]

## 2020-08-25 ENCOUNTER — Encounter (HOSPITAL_COMMUNITY): Payer: Self-pay | Admitting: Emergency Medicine

## 2020-08-25 ENCOUNTER — Other Ambulatory Visit: Payer: Self-pay

## 2020-08-25 ENCOUNTER — Ambulatory Visit (HOSPITAL_COMMUNITY)
Admission: EM | Admit: 2020-08-25 | Discharge: 2020-08-25 | Disposition: A | Discharge status: Home / Self Care | Payer: Medicare Other | Attending: Urgent Care | Admitting: Urgent Care

## 2020-08-25 DIAGNOSIS — R1084 Generalized abdominal pain: Secondary | ICD-10-CM

## 2020-08-25 DIAGNOSIS — M545 Low back pain, unspecified: Secondary | ICD-10-CM

## 2020-08-25 MED ORDER — METHOCARBAMOL 500 MG PO TABS: 500.0000 mg | ORAL_TABLET | Freq: Two times a day (BID) | ORAL | 0 refills | Status: AC

## 2020-08-25 NOTE — Discharge Instructions (Addendum)
Please just use Tylenol at a dose of 500mg-650mg once every 6 hours as needed for your aches, pains, fevers. Do not use any nonsteroidal anti-inflammatories (NSAIDs) like ibuprofen, Motrin, naproxen, Aleve, etc. which are all available over-the-counter.   
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
## 2020-08-27 ENCOUNTER — Encounter (HOSPITAL_COMMUNITY): Payer: Self-pay

## 2020-08-27 ENCOUNTER — Observation Stay (HOSPITAL_COMMUNITY): Payer: Medicare Other

## 2020-08-27 ENCOUNTER — Observation Stay (HOSPITAL_COMMUNITY)
Admission: EM | Admit: 2020-08-27 | Discharge: 2020-08-29 | Disposition: A | Discharge status: Home / Self Care | Payer: Medicare Other | Attending: General Surgery | Admitting: General Surgery

## 2020-08-27 ENCOUNTER — Other Ambulatory Visit: Payer: Self-pay

## 2020-08-27 ENCOUNTER — Emergency Department (HOSPITAL_COMMUNITY): Payer: Medicare Other

## 2020-08-27 DIAGNOSIS — Z20822 Contact with and (suspected) exposure to covid-19: Secondary | ICD-10-CM | POA: Insufficient documentation

## 2020-08-27 DIAGNOSIS — Z79899 Other long term (current) drug therapy: Secondary | ICD-10-CM | POA: Insufficient documentation

## 2020-08-27 DIAGNOSIS — I10 Essential (primary) hypertension: Secondary | ICD-10-CM | POA: Diagnosis not present

## 2020-08-27 DIAGNOSIS — S3991XA Unspecified injury of abdomen, initial encounter: Secondary | ICD-10-CM | POA: Diagnosis not present

## 2020-08-27 DIAGNOSIS — B9689 Other specified bacterial agents as the cause of diseases classified elsewhere: Secondary | ICD-10-CM | POA: Insufficient documentation

## 2020-08-27 DIAGNOSIS — F17210 Nicotine dependence, cigarettes, uncomplicated: Secondary | ICD-10-CM | POA: Insufficient documentation

## 2020-08-27 DIAGNOSIS — M545 Low back pain, unspecified: Secondary | ICD-10-CM | POA: Diagnosis not present

## 2020-08-27 DIAGNOSIS — E119 Type 2 diabetes mellitus without complications: Secondary | ICD-10-CM | POA: Diagnosis not present

## 2020-08-27 DIAGNOSIS — N390 Urinary tract infection, site not specified: Secondary | ICD-10-CM | POA: Insufficient documentation

## 2020-08-27 DIAGNOSIS — R109 Unspecified abdominal pain: Secondary | ICD-10-CM | POA: Diagnosis present

## 2020-08-27 DIAGNOSIS — N281 Cyst of kidney, acquired: Secondary | ICD-10-CM | POA: Diagnosis not present

## 2020-08-27 DIAGNOSIS — Z041 Encounter for examination and observation following transport accident: Secondary | ICD-10-CM | POA: Diagnosis not present

## 2020-08-27 DIAGNOSIS — R1084 Generalized abdominal pain: Secondary | ICD-10-CM | POA: Diagnosis not present

## 2020-08-27 DIAGNOSIS — K529 Noninfective gastroenteritis and colitis, unspecified: Secondary | ICD-10-CM | POA: Diagnosis not present

## 2020-08-27 HISTORY — DX: Essential (primary) hypertension: I10

## 2020-08-27 HISTORY — DX: Type 2 diabetes mellitus without complications: E11.9

## 2020-08-27 LAB — COMPREHENSIVE METABOLIC PANEL
ALT: 24 U/L (ref 0–44)
AST: 22 U/L (ref 15–41)
Albumin: 3.6 g/dL (ref 3.5–5.0)
Alkaline Phosphatase: 89 U/L (ref 38–126)
Anion gap: 14 (ref 5–15)
BUN: 14 mg/dL (ref 8–23)
CO2: 23 mmol/L (ref 22–32)
Calcium: 9.5 mg/dL (ref 8.9–10.3)
Chloride: 101 mmol/L (ref 98–111)
Creatinine, Ser: 1.02 mg/dL — ABNORMAL HIGH (ref 0.44–1.00)
GFR, Estimated: 57 mL/min — ABNORMAL LOW (ref >60)
Glucose, Bld: 166 mg/dL — ABNORMAL HIGH (ref 70–99)
Potassium: 3.8 mmol/L (ref 3.5–5.1)
Sodium: 138 mmol/L (ref 135–145)
Total Bilirubin: 0.8 mg/dL (ref 0.3–1.2)
Total Protein: 7 g/dL (ref 6.5–8.1)

## 2020-08-27 LAB — URINALYSIS, ROUTINE W REFLEX MICROSCOPIC
Glucose, UA: NEGATIVE mg/dL
Hgb urine dipstick: NEGATIVE
Ketones, ur: 15 mg/dL — AB
Nitrite: NEGATIVE
Protein, ur: NEGATIVE mg/dL
Specific Gravity, Urine: 1.025 (ref 1.005–1.030)
pH: 5.5 (ref 5.0–8.0)

## 2020-08-27 LAB — CBC
HCT: 44.9 % (ref 36.0–46.0)
Hemoglobin: 15.3 g/dL — ABNORMAL HIGH (ref 12.0–15.0)
MCH: 30.5 pg (ref 26.0–34.0)
MCHC: 34.1 g/dL (ref 30.0–36.0)
MCV: 89.6 fL (ref 80.0–100.0)
Platelets: 326 10*3/uL (ref 150–400)
RBC: 5.01 MIL/uL (ref 3.87–5.11)
RDW: 12.4 % (ref 11.5–15.5)
WBC: 9.4 10*3/uL (ref 4.0–10.5)
nRBC: 0 % (ref 0.0–0.2)

## 2020-08-27 LAB — URINALYSIS, MICROSCOPIC (REFLEX)
RBC / HPF: NONE SEEN RBC/hpf (ref 0–5)
Squamous Epithelial / LPF: NONE SEEN (ref 0–5)

## 2020-08-27 LAB — LIPASE, BLOOD: Lipase: 22 U/L (ref 11–51)

## 2020-08-27 LAB — CBG MONITORING, ED: Glucose-Capillary: 99 mg/dL (ref 70–99)

## 2020-08-27 LAB — HEMOGLOBIN A1C
Hgb A1c MFr Bld: 7 % — ABNORMAL HIGH (ref 4.8–5.6)
Mean Plasma Glucose: 154.2 mg/dL

## 2020-08-27 LAB — GLUCOSE, CAPILLARY: Glucose-Capillary: 101 mg/dL — ABNORMAL HIGH (ref 70–99)

## 2020-08-27 LAB — SARS CORONAVIRUS 2 (TAT 6-24 HRS): SARS Coronavirus 2: NEGATIVE

## 2020-08-27 MED ORDER — ONDANSETRON HCL 4 MG/2ML IJ SOLN
4.0000 mg | Freq: Once | INTRAMUSCULAR | Status: AC
  Administered 2020-08-27: 4 mg via INTRAVENOUS
  Filled 2020-08-27: qty 2

## 2020-08-27 MED ORDER — SODIUM CHLORIDE 0.9 % IV BOLUS
1000.0000 mL | Freq: Once | INTRAVENOUS | Status: AC
  Administered 2020-08-27: 1000 mL via INTRAVENOUS

## 2020-08-27 MED ORDER — MORPHINE SULFATE (PF) 4 MG/ML IV SOLN
4.0000 mg | Freq: Once | INTRAVENOUS | Status: AC
  Administered 2020-08-27: 4 mg via INTRAVENOUS
  Filled 2020-08-27: qty 1

## 2020-08-27 MED ORDER — POTASSIUM CHLORIDE IN NACL 20-0.45 MEQ/L-% IV SOLN
INTRAVENOUS | Status: DC
  Administered 2020-08-28: 1 mL via INTRAVENOUS
  Filled 2020-08-27 (×3): qty 1000

## 2020-08-27 MED ORDER — OXYCODONE HCL 5 MG PO TABS
5.0000 mg | ORAL_TABLET | ORAL | Status: DC | PRN
  Administered 2020-08-27: 5 mg via ORAL
  Filled 2020-08-27 (×2): qty 1

## 2020-08-27 MED ORDER — INSULIN ASPART 100 UNIT/ML ~~LOC~~ SOLN: 0.0000 [IU] | Freq: Three times a day (TID) | SUBCUTANEOUS | Status: DC

## 2020-08-27 MED ORDER — ENOXAPARIN SODIUM 30 MG/0.3ML ~~LOC~~ SOLN
30.0000 mg | Freq: Two times a day (BID) | SUBCUTANEOUS | Status: DC
  Administered 2020-08-28 – 2020-08-29 (×3): 30 mg via SUBCUTANEOUS
  Filled 2020-08-27 (×3): qty 0.3

## 2020-08-27 MED ORDER — MELATONIN 3 MG PO TABS
3.0000 mg | ORAL_TABLET | Freq: Every evening | ORAL | Status: DC | PRN
  Administered 2020-08-27: 3 mg via ORAL
  Filled 2020-08-27 (×2): qty 1

## 2020-08-27 MED ORDER — MORPHINE SULFATE (PF) 2 MG/ML IV SOLN
1.0000 mg | INTRAVENOUS | Status: DC | PRN
  Administered 2020-08-27: 2 mg via INTRAVENOUS
  Filled 2020-08-27: qty 1

## 2020-08-27 MED ORDER — ONDANSETRON HCL 4 MG/2ML IJ SOLN: 4.0000 mg | Freq: Four times a day (QID) | INTRAMUSCULAR | Status: DC | PRN

## 2020-08-27 MED ORDER — IOHEXOL 300 MG/ML  SOLN
100.0000 mL | Freq: Once | INTRAMUSCULAR | Status: AC | PRN
  Administered 2020-08-27: 100 mL via INTRAVENOUS

## 2020-08-27 MED ORDER — ACETAMINOPHEN 500 MG PO TABS
1000.0000 mg | ORAL_TABLET | Freq: Four times a day (QID) | ORAL | Status: DC
  Administered 2020-08-27 – 2020-08-29 (×7): 1000 mg via ORAL
  Filled 2020-08-27 (×8): qty 2

## 2020-08-27 MED ORDER — METHOCARBAMOL 500 MG PO TABS
500.0000 mg | ORAL_TABLET | Freq: Two times a day (BID) | ORAL | Status: DC
  Administered 2020-08-27 – 2020-08-29 (×5): 500 mg via ORAL
  Filled 2020-08-27 (×5): qty 1

## 2020-08-27 MED ORDER — ONDANSETRON 4 MG PO TBDP
4.0000 mg | ORAL_TABLET | Freq: Four times a day (QID) | ORAL | Status: DC | PRN
  Administered 2020-08-28: 4 mg via ORAL
  Filled 2020-08-27: qty 1

## 2020-08-27 MED ORDER — AMLODIPINE BESYLATE 10 MG PO TABS
10.0000 mg | ORAL_TABLET | Freq: Every day | ORAL | Status: DC
  Administered 2020-08-28 – 2020-08-29 (×2): 10 mg via ORAL
  Filled 2020-08-27 (×2): qty 1

## 2020-08-27 MED ORDER — METOPROLOL TARTRATE 5 MG/5ML IV SOLN: 5.0000 mg | Freq: Four times a day (QID) | INTRAVENOUS | Status: DC | PRN

## 2020-08-27 MED ORDER — PANTOPRAZOLE SODIUM 40 MG PO TBEC
40.0000 mg | DELAYED_RELEASE_TABLET | Freq: Every day | ORAL | Status: DC
  Administered 2020-08-27 – 2020-08-29 (×3): 40 mg via ORAL
  Filled 2020-08-27 (×3): qty 1

## 2020-08-27 MED ORDER — PANTOPRAZOLE SODIUM 40 MG IV SOLR: 40.0000 mg | Freq: Every day | INTRAVENOUS | Status: DC
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
## 2020-08-27 NOTE — ED Notes (Signed)
Pt in CT.

## 2020-08-27 NOTE — ED Notes (Signed)
Called for a hosptial bed
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
## 2020-08-27 NOTE — ED Notes (Signed)
Pt asking for food and a sleep aid. MD being paged.

## 2020-08-28 DIAGNOSIS — R1084 Generalized abdominal pain: Secondary | ICD-10-CM | POA: Diagnosis not present

## 2020-08-28 LAB — URINE CULTURE: Culture: 30000 — AB

## 2020-08-28 LAB — CBC
HCT: 35.4 % — ABNORMAL LOW (ref 36.0–46.0)
Hemoglobin: 12.3 g/dL (ref 12.0–15.0)
MCH: 30.5 pg (ref 26.0–34.0)
MCHC: 34.7 g/dL (ref 30.0–36.0)
MCV: 87.8 fL (ref 80.0–100.0)
Platelets: 204 10*3/uL (ref 150–400)
RBC: 4.03 MIL/uL (ref 3.87–5.11)
RDW: 12.3 % (ref 11.5–15.5)
WBC: 7.7 10*3/uL (ref 4.0–10.5)
nRBC: 0 % (ref 0.0–0.2)

## 2020-08-28 LAB — BASIC METABOLIC PANEL
Anion gap: 10 (ref 5–15)
BUN: 10 mg/dL (ref 8–23)
CO2: 21 mmol/L — ABNORMAL LOW (ref 22–32)
Calcium: 8.5 mg/dL — ABNORMAL LOW (ref 8.9–10.3)
Chloride: 105 mmol/L (ref 98–111)
Creatinine, Ser: 0.7 mg/dL (ref 0.44–1.00)
GFR, Estimated: >60 mL/min (ref >60)
Glucose, Bld: 94 mg/dL (ref 70–99)
Potassium: 3.7 mmol/L (ref 3.5–5.1)
Sodium: 136 mmol/L (ref 135–145)

## 2020-08-28 LAB — GLUCOSE, CAPILLARY
Glucose-Capillary: 72 mg/dL (ref 70–99)
Glucose-Capillary: 130 mg/dL — ABNORMAL HIGH (ref 70–99)
Glucose-Capillary: 90 mg/dL (ref 70–99)
Glucose-Capillary: 152 mg/dL — ABNORMAL HIGH (ref 70–99)

## 2020-08-28 MED ORDER — SIMETHICONE 80 MG PO CHEW: 80.0000 mg | CHEWABLE_TABLET | Freq: Four times a day (QID) | ORAL | Status: DC | PRN

## 2020-08-28 MED ORDER — SODIUM CHLORIDE 0.9 % IV SOLN
1.0000 g | INTRAVENOUS | Status: DC
  Administered 2020-08-28: 1 g via INTRAVENOUS
  Filled 2020-08-28: qty 10
  Filled 2020-08-28: qty 1

## 2020-08-28 MED ORDER — DOCUSATE SODIUM 100 MG PO CAPS
100.0000 mg | ORAL_CAPSULE | Freq: Two times a day (BID) | ORAL | Status: DC
  Administered 2020-08-28 – 2020-08-29 (×3): 100 mg via ORAL
  Filled 2020-08-28 (×2): qty 1
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
## 2020-08-28 NOTE — Care Management Obs Status (Signed)
MEDICARE OBSERVATION STATUS NOTIFICATION   Patient Details  Name: Carol Ortiz MRN: [id number] Date of Birth: 10-11-1945   Medicare Observation Status Notification Given:  Yes    Glennon Mac, RN 08/28/2020, 4:54 PM

## 2020-08-28 NOTE — Progress Notes (Signed)
Pt was given full breakfast, eggs, bacon, toast, and grits. Pt stated she was so hungry. This nurse educated pt to report increased pain or n/v after eating. Less than 10 minutes after leaving pt rm pt called out for pain meds. Upon entering the room it was noted pt ate 100% of the meal and was now complaining of upper abd pain and some nausea. Medications were administered. Will continue to monitor. Mayford Knife RN

## 2020-08-28 NOTE — Plan of Care (Signed)

## 2020-08-28 NOTE — TOC CAGE-AID Note (Signed)
Transition of Care [facility]) - CAGE-AID Screening   Patient Details  Name: Carol Ortiz MRN: [id number] Date of Birth: 1945/10/12   Clinical Narrative:  Patient denies current alcohol or drug use.  CAGE-AID Screening:    Have You Ever Felt You Ought to Cut Down on Your Drinking or Drug Use?: No Have People Annoyed You By Critizing Your Drinking Or Drug Use?: No Have You Felt Bad Or Guilty About Your Drinking Or Drug Use?: No Have You Ever Had a Drink or Used Drugs First Thing In The Morning to Steady Your Nerves or to Get Rid of a Hangover?: No CAGE-AID Score: 0  Substance Abuse Education Offered: No

## 2020-08-29 DIAGNOSIS — R1084 Generalized abdominal pain: Secondary | ICD-10-CM | POA: Diagnosis not present

## 2020-08-29 LAB — CBC
HCT: 37.8 % (ref 36.0–46.0)
Hemoglobin: 12.3 g/dL (ref 12.0–15.0)
MCH: 29.4 pg (ref 26.0–34.0)
MCHC: 32.5 g/dL (ref 30.0–36.0)
MCV: 90.2 fL (ref 80.0–100.0)
Platelets: 250 10*3/uL (ref 150–400)
RBC: 4.19 MIL/uL (ref 3.87–5.11)
RDW: 12.1 % (ref 11.5–15.5)
WBC: 5.9 10*3/uL (ref 4.0–10.5)
nRBC: 0 % (ref 0.0–0.2)

## 2020-08-29 LAB — GLUCOSE, CAPILLARY: Glucose-Capillary: 123 mg/dL — ABNORMAL HIGH (ref 70–99)

## 2020-08-29 LAB — BASIC METABOLIC PANEL
Anion gap: 8 (ref 5–15)
BUN: 9 mg/dL (ref 8–23)
CO2: 24 mmol/L (ref 22–32)
Calcium: 8.9 mg/dL (ref 8.9–10.3)
Chloride: 108 mmol/L (ref 98–111)
Creatinine, Ser: 0.69 mg/dL (ref 0.44–1.00)
GFR, Estimated: >60 mL/min (ref >60)
Glucose, Bld: 135 mg/dL — ABNORMAL HIGH (ref 70–99)
Potassium: 3.8 mmol/L (ref 3.5–5.1)
Sodium: 140 mmol/L (ref 135–145)

## 2020-08-29 MED ORDER — NITROFURANTOIN MONOHYD MACRO 100 MG PO CAPS: 100.0000 mg | ORAL_CAPSULE | Freq: Two times a day (BID) | ORAL | 0 refills | Status: AC

## 2020-08-29 MED ORDER — ACETAMINOPHEN 500 MG PO TABS: 500.0000 mg | ORAL_TABLET | Freq: Four times a day (QID) | ORAL | Status: AC | PRN
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
## 2020-08-29 NOTE — Progress Notes (Signed)
Pt given dc instructions given to pt and husband at bedside. Pt verbalized understanding. PIV removed pt tolerated well. Pt taken to vehicle via wc. Mayford Knife RN

## 2020-08-29 NOTE — Discharge Instructions (Signed)
Blunt Abdominal Trauma  Blunt abdominal trauma (BAT) includes injuries in which the wall of the abdomen and pelvis or the organs in the abdomen are damaged. These organs may include the liver, spleen, kidneys, stomach, intestines, pancreas, bladder, and uterus. Injured blood vessels from abdominal trauma and pelvic fractures can cause life-threatening internal bleeding. The damage can involve bruising, tearing, or rupture. BAT does not usually involve a puncture of the skin. Motor vehicle crashes are a common cause of BAT. Seat belts reduce the risk of serious injury but do not guarantee against abdominal injuries. If seat belts are worn incorrectly, they may actually cause an abdominal injury. BAT can range from mild to severe. In some cases, it can lead to severe swelling and irritation of the tissue that lines the inner wall of the abdomen (peritonitis), severe bleeding, and a dangerous drop in blood pressure due to internal bleeding (hypotension and shock). What are the causes? This condition may be caused by a hard, direct hit to the abdomen. This can happen:  In a motor vehicle crash.  If you are kicked or punched in the abdomen.  From sports injuries, such as collisions with other players.  If you fall from a significant height.  If you wear your seat belt incorrectly, such as only using the lap belt.  In a motorcycle or bicycle crash from striking the handlebars. What are the signs or symptoms? The main symptom of this condition is pain and tenderness in the abdomen. The pain may spread to the back or shoulder. Symptoms are:  Bruising. (In severe motor vehicle crashes, there may be bruising to the area where the seat belt comes in contact with the body.)  Swelling.  Tenderness when pressing on the abdomen.  Blood in the urine.  Nausea and vomiting, and vomiting of blood.  Bloody stool or bleeding from the rectum.  Trouble breathing. Other symptoms depend on the type and  location of the injury. Symptoms can include:  Loss of consciousness.  Confusion.  Dizziness and light-headedness.  Anxiety.  Pale, dark, cool, or sweaty skin.  Weakness. Symptoms of this injury can develop suddenly or slowly. Some injuries may not be noticed for several days. How is this diagnosed? This condition is diagnosed based on your symptoms and a physical exam. You may also have tests, including:  Blood tests.  Urine tests.  Imaging tests, such as: ? An ultrasound of the abdomen to look for bleeding. ? A CT scan of the abdomen to identify specific injuries. ? X-rays of your chest, abdomen, and pelvis. These may show fractures of the ribs and pelvis. How is this treated? Treatment for this condition depends on the injury and how severe it is. Treatment may include:  Repeated exams of the abdomen. This is done if the injury is mild.  Support for blood pressure, heart rate, and breathing.  Getting blood, fluids, medicine, or nutrition (total parenteral nutrition, or TPN) through an IV.  Antibiotic medicine.  A blood transfusion. This may be given if you have lost a significant amount of blood.  Angiographic embolization. This is a procedure to stop bleeding by putting a small, thin tube (catheter) into one of your blood vessels. Steel coils or a clotting agent may then be placed into the bleeding vessel to stop blood loss.  Laparotomy. This is a surgery to open up your abdomen to control bleeding or repair damage. This may be done if you have peritonitis or bleeding that cannot be controlled with angiographic  embolization. This also may be done if internal bleeding is suspected. Follow these instructions at home: Medicines  Take over-the-counter and prescription medicines only as told by your health care provider.  If you were prescribed an antibiotic medicine, take it as told by your health care provider. Do not stop using the antibiotic even if you start to feel  better.  Ask your health care provider if the medicine prescribed to you: ? Requires you to avoid driving or using machinery. ? Can cause constipation. You may need to take these actions to prevent or treat constipation:  Drink enough fluid to keep your urine pale yellow.  Take over-the-counter or prescription medicines.  Eat foods that are high in fiber, such as beans, whole grains, and fresh fruits and vegetables.  Limit foods that are high in fat and processed sugars, such as fried or sweet foods. General instructions  Do not use any products that contain nicotine or tobacco, such as cigarettes, e-cigarettes, and chewing tobacco. These can delay healing after an injury. If you need help quitting, ask your health care provider.  Return to your normal activities as told by your health care provider. Ask your health care provider what activities are safe for you.  Keep all follow-up visits. This is important to your treatment plan. Follow-up visits may include counseling and appointments with different medical specialists. Get help right away if:  You have worsening abdominal, back, or chest pain.  You have worsening abdominal tenderness.  You have worsening abdominal swelling (distension).  You have any of these: ? Blood in your urine or stool. ? You stop producing urine or fail to have a bowel movement after 2-3 days. ? Trouble with breathing. ? A high fever. ? Dizziness.  You vomit blood.  Your skin looks very pale. These symptoms may represent a serious problem that is an emergency. Do not wait to see if the symptoms will go away. Get medical help right away. Call your local emergency services (911 in the [place] Do not drive yourself to the hospital. Summary  Blunt abdominal trauma (BAT) includes injuries in which the wall of the abdomen and pelvis or the organs in the abdomen are damaged. It may be caused by a hard, direct hit to the abdomen. Motor vehicle crashes are a  common cause of BAT.  Symptoms include pain and tenderness in the abdomen, bruising, swelling, blood in the urine or stool, difficulty breathing, weakness, confusion, or loss of consciousness.  Treatment for this condition depends on what type of injury you have and how severe it is. Some serious injuries require urgent surgery. This information is not intended to replace advice given to you by your health care provider. Make sure you discuss any questions you have with your health care provider. Document Revised: 11/23/2019 Document Reviewed: 11/23/2019 Elsevier Patient Education  2021 [employer] Inc[employer]   Urinary Tract Infection, Adult A urinary tract infection (UTI) is an infection of any part of the urinary tract. The urinary tract includes:  The kidneys.  The ureters.  The bladder.  The urethra. These organs make, store, and get rid of pee (urine) in the body. What are the causes? This infection is caused by germs (bacteria) in your genital area. These germs grow and cause swelling (inflammation) of your urinary tract. What increases the risk? The following factors may make you more likely to develop this condition:  Using a small, thin tube (catheter) to drain pee.  Not being able to control when you  pee or poop (incontinence).  Being female. If you are female, these things can increase the risk: ? Using these methods to prevent pregnancy:  A medicine that kills sperm (spermicide).  A device that blocks sperm (diaphragm). ? Having low levels of a female hormone (estrogen). ? Being pregnant. You are more likely to develop this condition if:  You have genes that add to your risk.  You are sexually active.  You take antibiotic medicines.  You have trouble peeing because of: ? A prostate that is bigger than normal, if you are female. ? A blockage in the part of your body that drains pee from the bladder. ? A kidney stone. ? A nerve condition that affects your  bladder. ? Not getting enough to drink. ? Not peeing often enough.  You have other conditions, such as: ? Diabetes. ? A weak disease-fighting system (immune system). ? Sickle cell disease. ? Gout. ? Injury of the spine. What are the signs or symptoms? Symptoms of this condition include:  Needing to pee right away.  Peeing small amounts often.  Pain or burning when peeing.  Blood in the pee.  Pee that smells bad or not like normal.  Trouble peeing.  Pee that is cloudy.  Fluid coming from the vagina, if you are female.  Pain in the belly or lower back. Other symptoms include:  Vomiting.  Not feeling hungry.  Feeling mixed up (confused). This may be the first symptom in older adults.  Being tired and grouchy (irritable).  A fever.  Watery poop (diarrhea). How is this treated?  Taking antibiotic medicine.  Taking other medicines.  Drinking enough water. In some cases, you may need to see a specialist. Follow these instructions at home: Medicines  Take over-the-counter and prescription medicines only as told by your doctor.  If you were prescribed an antibiotic medicine, take it as told by your doctor. Do not stop taking it even if you start to feel better. General instructions  Make sure you: ? Pee until your bladder is empty. ? Do not hold pee for a long time. ? Empty your bladder after sex. ? Wipe from front to back after peeing or pooping if you are a female. Use each tissue one time when you wipe.  Drink enough fluid to keep your pee pale yellow.  Keep all follow-up visits.   Contact a doctor if:  You do not get better after 1-2 days.  Your symptoms go away and then come back. Get help right away if:  You have very bad back pain.  You have very bad pain in your lower belly.  You have a fever.  You have chills.  You feeling like you will vomit or you vomit. Summary  A urinary tract infection (UTI) is an infection of any part of the  urinary tract.  This condition is caused by germs in your genital area.  There are many risk factors for a UTI.  Treatment includes antibiotic medicines.  Drink enough fluid to keep your pee pale yellow. This information is not intended to replace advice given to you by your health care provider. Make sure you discuss any questions you have with your health care provider. Document Revised: 02/22/2020 Document Reviewed: 02/22/2020 Elsevier Patient Education  2021 [employer] Inc.   Colitis  Colitis is a condition in which the colon is inflamed. It can cause diarrhea, blood in the stool, and abdominal pain. Colitis can last a short time (be acute), or it may  last a long time (become chronic). What are the causes? This condition may be caused by:  Infections from viruses or bacteria.  A reaction to medicine.  Certain autoimmune diseases, such as Crohn's disease or ulcerative colitis.  Radiation treatment.  Decreased blood flow to the bowel (ischemia). What are the signs or symptoms? Symptoms of this condition include:  Diarrhea, blood in the stool, or black, tarry stool.  Pain in the joints or abdominal pain.  Fever or fatigue.  Vomiting.  Weight loss.  Bloating.  Having fewer bowel movements than usual.  A strong and sudden urge to have a bowel movement.  Feeling like the bowel is not empty after a bowel movement. How is this diagnosed? This condition may be diagnosed based on a stool test and a blood test. You may also have other tests, such as:  X-rays.  CT scan.  Colonoscopy.  Endoscopy.  Biopsy. How is this treated? Treatment for this condition depends on the cause. This condition may be treated with:  Steps to rest the bowel, such as not eating or drinking for a period of time.  Fluids that are given through an IV.  Medicine for pain and diarrhea.  Antibiotic medicines.  Cortisone medicines.  Surgery. Follow these instructions at  home: Eating and drinking  Follow instructions from your health care provider about eating or drinking restrictions.  Drink enough fluid to keep your urine pale yellow.  Work with a dietitian to determine whether certain foods cause your condition to flare up.  Avoid foods or drinks that cause flare-ups.  Eat a well-balanced diet.   General instructions  If you were prescribed an antibiotic medicine, take it as told by your health care provider. Do not stop taking the antibiotic even if you start to feel better.  Take over-the-counter and prescription medicines only as told by your health care provider.  Keep all follow-up visits. This is important. Contact a health care provider if:  Your symptoms do not go away.  You develop new symptoms. Get help right away if:  You have a fever that does not go away with treatment.  You develop chills.  You have extreme weakness, fainting, or dehydration.  You vomit repeatedly.  You develop severe pain in your abdomen.  You pass bloody or tarry stool. Summary  Colitis is a condition in which the colon is inflamed. Colitis can last a short time (be acute), or it may last a long time (become chronic).  Treatment for this condition depends on the cause and may include resting the bowel, taking medicines, or having surgery.  If you were prescribed an antibiotic medicine, take it as told by your health care provider. Do not stop taking the antibiotic even if you start to feel better.  Get help right away if you develop severe pain in your abdomen.  Keep all follow-up visits. This is important. This information is not intended to replace advice given to you by your health care provider. Make sure you discuss any questions you have with your health care provider. Document Revised: 03/18/2020 Document Reviewed: 03/18/2020 Elsevier Patient Education  2021 [employer].

## 2020-09-03 DIAGNOSIS — R300 Dysuria: Secondary | ICD-10-CM | POA: Diagnosis not present

## 2020-09-29 DIAGNOSIS — I10 Essential (primary) hypertension: Secondary | ICD-10-CM | POA: Diagnosis not present

## 2020-09-29 DIAGNOSIS — E785 Hyperlipidemia, unspecified: Secondary | ICD-10-CM | POA: Diagnosis not present

## 2020-09-29 DIAGNOSIS — E119 Type 2 diabetes mellitus without complications: Secondary | ICD-10-CM | POA: Diagnosis not present

## 2020-09-29 DIAGNOSIS — R7989 Other specified abnormal findings of blood chemistry: Secondary | ICD-10-CM | POA: Diagnosis not present

## 2020-10-06 DIAGNOSIS — Z0000 Encounter for general adult medical examination without abnormal findings: Secondary | ICD-10-CM | POA: Diagnosis not present

## 2020-10-06 DIAGNOSIS — E119 Type 2 diabetes mellitus without complications: Secondary | ICD-10-CM | POA: Diagnosis not present

## 2020-10-06 DIAGNOSIS — E785 Hyperlipidemia, unspecified: Secondary | ICD-10-CM | POA: Diagnosis not present

## 2020-10-06 DIAGNOSIS — K5900 Constipation, unspecified: Secondary | ICD-10-CM | POA: Diagnosis not present

## 2020-10-06 DIAGNOSIS — I10 Essential (primary) hypertension: Secondary | ICD-10-CM | POA: Diagnosis not present

## 2020-10-06 DIAGNOSIS — M5134 Other intervertebral disc degeneration, thoracic region: Secondary | ICD-10-CM | POA: Diagnosis not present

## 2020-11-11 DIAGNOSIS — Z0000 Encounter for general adult medical examination without abnormal findings: Secondary | ICD-10-CM | POA: Diagnosis not present

## 2020-11-11 DIAGNOSIS — R1030 Lower abdominal pain, unspecified: Secondary | ICD-10-CM | POA: Diagnosis not present

## 2020-11-11 DIAGNOSIS — R109 Unspecified abdominal pain: Secondary | ICD-10-CM | POA: Diagnosis not present

## 2020-11-11 DIAGNOSIS — R7309 Other abnormal glucose: Secondary | ICD-10-CM | POA: Diagnosis not present

## 2020-11-11 DIAGNOSIS — E119 Type 2 diabetes mellitus without complications: Secondary | ICD-10-CM | POA: Diagnosis not present

## 2020-11-12 ENCOUNTER — Other Ambulatory Visit: Payer: Self-pay | Admitting: Family Medicine

## 2020-11-12 DIAGNOSIS — R109 Unspecified abdominal pain: Secondary | ICD-10-CM

## 2020-11-12 DIAGNOSIS — R1030 Lower abdominal pain, unspecified: Secondary | ICD-10-CM

## 2020-11-25 ENCOUNTER — Other Ambulatory Visit: Payer: Self-pay

## 2020-11-25 ENCOUNTER — Ambulatory Visit
Admission: RE | Admit: 2020-11-25 | Discharge: 2020-11-25 | Disposition: A | Discharge status: Home / Self Care | Payer: Medicare Other | Source: Ambulatory Visit | Attending: Family Medicine | Admitting: Family Medicine

## 2020-11-25 DIAGNOSIS — K3189 Other diseases of stomach and duodenum: Secondary | ICD-10-CM | POA: Diagnosis not present

## 2020-11-25 DIAGNOSIS — S3991XA Unspecified injury of abdomen, initial encounter: Secondary | ICD-10-CM | POA: Diagnosis not present

## 2020-11-25 DIAGNOSIS — N1330 Unspecified hydronephrosis: Secondary | ICD-10-CM | POA: Diagnosis not present

## 2020-11-25 DIAGNOSIS — R1030 Lower abdominal pain, unspecified: Secondary | ICD-10-CM

## 2020-11-25 DIAGNOSIS — R109 Unspecified abdominal pain: Secondary | ICD-10-CM

## 2020-11-25 DIAGNOSIS — K5900 Constipation, unspecified: Secondary | ICD-10-CM | POA: Diagnosis not present

## 2020-11-25 MED ORDER — IOPAMIDOL (ISOVUE-300) INJECTION 61%
100.0000 mL | Freq: Once | INTRAVENOUS | Status: AC | PRN
  Administered 2020-11-25: 100 mL via INTRAVENOUS

## 2020-12-02 ENCOUNTER — Other Ambulatory Visit: Payer: Self-pay | Admitting: Family Medicine

## 2020-12-02 DIAGNOSIS — N8500 Endometrial hyperplasia, unspecified: Secondary | ICD-10-CM

## 2020-12-11 ENCOUNTER — Ambulatory Visit
Admission: RE | Admit: 2020-12-11 | Discharge: 2020-12-11 | Disposition: A | Discharge status: Home / Self Care | Payer: Medicare Other | Source: Ambulatory Visit | Attending: Family Medicine | Admitting: Family Medicine

## 2020-12-11 DIAGNOSIS — N8500 Endometrial hyperplasia, unspecified: Secondary | ICD-10-CM

## 2020-12-11 DIAGNOSIS — R9389 Abnormal findings on diagnostic imaging of other specified body structures: Secondary | ICD-10-CM | POA: Diagnosis not present

## 2020-12-11 DIAGNOSIS — Z780 Asymptomatic menopausal state: Secondary | ICD-10-CM | POA: Diagnosis not present

## 2020-12-30 DIAGNOSIS — M1712 Unilateral primary osteoarthritis, left knee: Secondary | ICD-10-CM | POA: Diagnosis not present

## 2020-12-31 DIAGNOSIS — M1712 Unilateral primary osteoarthritis, left knee: Secondary | ICD-10-CM | POA: Diagnosis not present

## 2020-12-31 DIAGNOSIS — S8992XA Unspecified injury of left lower leg, initial encounter: Secondary | ICD-10-CM | POA: Diagnosis not present

## 2021-01-13 DIAGNOSIS — M1712 Unilateral primary osteoarthritis, left knee: Secondary | ICD-10-CM | POA: Diagnosis not present

## 2021-02-24 DIAGNOSIS — M1712 Unilateral primary osteoarthritis, left knee: Secondary | ICD-10-CM | POA: Diagnosis not present

## 2021-03-26 DIAGNOSIS — H04029 Chronic dacryoadenitis, unspecified lacrimal gland: Secondary | ICD-10-CM | POA: Diagnosis not present

## 2021-03-26 DIAGNOSIS — E119 Type 2 diabetes mellitus without complications: Secondary | ICD-10-CM | POA: Diagnosis not present

## 2021-03-31 DIAGNOSIS — M533 Sacrococcygeal disorders, not elsewhere classified: Secondary | ICD-10-CM | POA: Diagnosis not present

## 2021-03-31 DIAGNOSIS — M7918 Myalgia, other site: Secondary | ICD-10-CM | POA: Diagnosis not present

## 2021-03-31 DIAGNOSIS — M545 Low back pain, unspecified: Secondary | ICD-10-CM | POA: Diagnosis not present

## 2021-04-06 DIAGNOSIS — E785 Hyperlipidemia, unspecified: Secondary | ICD-10-CM | POA: Diagnosis not present

## 2021-04-06 DIAGNOSIS — E119 Type 2 diabetes mellitus without complications: Secondary | ICD-10-CM | POA: Diagnosis not present

## 2021-04-06 DIAGNOSIS — I10 Essential (primary) hypertension: Secondary | ICD-10-CM | POA: Diagnosis not present

## 2021-04-14 DIAGNOSIS — Z5320 Procedure and treatment not carried out because of patient's decision for unspecified reasons: Secondary | ICD-10-CM | POA: Diagnosis not present

## 2021-04-14 DIAGNOSIS — E119 Type 2 diabetes mellitus without complications: Secondary | ICD-10-CM | POA: Diagnosis not present

## 2021-04-14 DIAGNOSIS — E785 Hyperlipidemia, unspecified: Secondary | ICD-10-CM | POA: Diagnosis not present

## 2021-04-14 DIAGNOSIS — M5134 Other intervertebral disc degeneration, thoracic region: Secondary | ICD-10-CM | POA: Diagnosis not present

## 2021-04-14 DIAGNOSIS — I10 Essential (primary) hypertension: Secondary | ICD-10-CM | POA: Diagnosis not present

## 2021-05-18 DIAGNOSIS — K5900 Constipation, unspecified: Secondary | ICD-10-CM | POA: Diagnosis not present

## 2021-05-18 DIAGNOSIS — M5134 Other intervertebral disc degeneration, thoracic region: Secondary | ICD-10-CM | POA: Diagnosis not present

## 2021-05-18 DIAGNOSIS — M5136 Other intervertebral disc degeneration, lumbar region: Secondary | ICD-10-CM | POA: Diagnosis not present

## 2021-05-25 ENCOUNTER — Encounter (HOSPITAL_COMMUNITY): Payer: Self-pay

## 2021-05-25 ENCOUNTER — Ambulatory Visit (HOSPITAL_COMMUNITY)
Admission: EM | Admit: 2021-05-25 | Discharge: 2021-05-25 | Disposition: A | Discharge status: Home / Self Care | Payer: Medicare Other | Attending: Emergency Medicine | Admitting: Emergency Medicine

## 2021-05-25 ENCOUNTER — Other Ambulatory Visit: Payer: Self-pay

## 2021-05-25 DIAGNOSIS — L02212 Cutaneous abscess of back [any part, except buttock]: Secondary | ICD-10-CM

## 2021-05-25 DIAGNOSIS — L0291 Cutaneous abscess, unspecified: Secondary | ICD-10-CM

## 2021-05-25 MED ORDER — DOXYCYCLINE HYCLATE 100 MG PO CAPS: 100.0000 mg | ORAL_CAPSULE | Freq: Two times a day (BID) | ORAL | 0 refills | Status: DC

## 2021-05-25 NOTE — ED Triage Notes (Signed)
Pt presents with wound on back that is hard, inflamed and draining X 2 weeks.

## 2021-05-25 NOTE — ED Provider Notes (Signed)
[facility]    CSN: [id number] Arrival date & time: 05/25/21  1410      History   Chief Complaint Chief Complaint  Patient presents with   Wound Check    HPI Carol Ortiz is a 75 y.o. female.   Patient presents to the urgent care with chief complaint of abscess.  She states that she had a "blackhead" on her right upper back, which has grown in size and become inflamed.  She reports that its been draining some pus for 2 weeks.  She denies any fever or chills.  She states that it is tender to palpation.  She denies having taken anything for symptoms.  Denies any other associated symptoms.  The history is provided by the patient. No language interpreter was used.   Past Medical History:  Diagnosis Date   Chest pain    normal echo 2012   Diabetes mellitus without complication ([place])    Hypertension     Patient Active Problem List   Diagnosis Date Noted   Abdominal pain 08/27/2020   Chest pain 12/23/2010   Tobacco abuse 12/23/2010   Hypertension 12/23/2010    Past Surgical History:  Procedure Laterality Date   CESAREAN SECTION      OB History   No obstetric history on file.      Home Medications    Prior to Admission medications   Medication Sig Start Date End Date Taking? Authorizing Provider  acetaminophen (TYLENOL) 500 MG tablet Take 1 tablet (500 mg total) by mouth every 6 (six) hours as needed for mild pain, fever or headache. 08/29/20   Norm Parcel, PA-C  amLODipine (NORVASC) 5 MG tablet TAKE 1 TABLET BY MOUTH EVERY DAY Patient taking differently: Take 5 mg by mouth daily. 02/06/18   Jaynee Eagles, PA-C  methocarbamol (ROBAXIN) 500 MG tablet Take 1 tablet (500 mg total) by mouth 2 (two) times daily. 08/25/20   Jaynee Eagles, PA-C    Family History Family History  Problem Relation Age of Onset   Coronary artery disease Mother        Died at age 46 of MI   Coronary artery disease Father        Age 60    Social History Social History    Tobacco Use   Smoking status: Every Day    Types: Cigarettes   Smokeless tobacco: Never  Vaping Use   Vaping Use: Never used  Substance Use Topics   Alcohol use: No   Drug use: No     Allergies   Patient has no known allergies.   Review of Systems Review of Systems  All other systems reviewed and are negative.   Physical Exam Triage Vital Signs ED Triage Vitals  Enc Vitals Group     BP 05/25/21 1518 (!) 141/89     Pulse Rate 05/25/21 1518 96     Resp 05/25/21 1518 17     Temp 05/25/21 1518 98.9 F (37.2 C)     Temp Source 05/25/21 1518 Oral     SpO2 05/25/21 1518 98 %     Weight --      Height --      Head Circumference --      Peak Flow --      Pain Score 05/25/21 1520 6     Pain Loc --      Pain Edu? --      Excl. in [place]? --    No data found.  Updated Vital Signs BP (!) 141/89 (BP Location: Right Arm)   Pulse 96   Temp 98.9 F (37.2 C) (Oral)   Resp 17   SpO2 98%   Visual Acuity Right Eye Distance:   Left Eye Distance:   Bilateral Distance:    Right Eye Near:   Left Eye Near:    Bilateral Near:     Physical Exam Vitals and nursing note reviewed.  Constitutional:      General: She is not in acute distress.    Appearance: She is well-developed.  HENT:     Head: Normocephalic and atraumatic.  Eyes:     Conjunctiva/sclera: Conjunctivae normal.  Cardiovascular:     Rate and Rhythm: Normal rate.     Heart sounds: No murmur heard. Pulmonary:     Effort: Pulmonary effort is normal. No respiratory distress.  Abdominal:     General: There is no distension.  Musculoskeletal:     Cervical back: Neck supple.     Comments: Moves all extremities  Skin:    General: Skin is warm and dry.     Comments: 2x3 cm abscess to right mid back with some surrounding erythema  Neurological:     Mental Status: She is alert and oriented to person, place, and time.  Psychiatric:        Mood and Affect: Mood normal.        Behavior: Behavior normal.      UC Treatments / Results  Labs (all labs ordered are listed, but only abnormal results are displayed) Labs Reviewed - No data to display  EKG   Radiology No results found.  Procedures Incision and Drainage  Date/Time: 05/25/2021 3:38 PM Performed by: Montine Circle, PA-C Authorized by: Montine Circle, PA-C   Consent:    Consent obtained:  Verbal   Consent given by:  Patient   Risks discussed:  Bleeding, incomplete drainage, pain and damage to other organs   Alternatives discussed:  No treatment Universal protocol:    Procedure explained and questions answered to patient or proxy's satisfaction: yes     Relevant documents present and verified: yes     Test results available : yes     Imaging studies available: yes     Required blood products, implants, devices, and special equipment available: yes     Site/side marked: yes     Immediately prior to procedure, a time out was called: yes     Patient identity confirmed:  Verbally with patient Location:    Type:  Abscess   Size:  2x3cm   Location:  Trunk   Trunk location:  Back Pre-procedure details:    Skin preparation:  Betadine Sedation:    Sedation type:  None Anesthesia:    Anesthesia method:  Local infiltration   Local anesthetic:  Lidocaine 1% w/o epi Procedure type:    Complexity:  Complex Procedure details:    Incision types:  Single straight   Incision depth:  Subcutaneous   Wound management:  Probed and deloculated, irrigated with saline and extensive cleaning   Drainage:  Purulent   Drainage amount:  Moderate   Packing materials:  None Post-procedure details:    Procedure completion:  Tolerated well, no immediate complications (including critical care time)  Medications Ordered in UC Medications - No data to display  Initial Impression / Assessment and Plan / UC Course  I have reviewed the triage vital signs and the nursing notes.  Pertinent labs & imaging results that were available  during my care of the patient were reviewed by me and considered in my medical decision making (see chart for details).     Patient here with abscess on her right upper back, incision and drainage with good results.  Patient is afebrile.  She does have some local erythematous changes to the surrounding skin, will start patient on doxycycline.  Return precautions discussed.  Patient understands and agrees with plan. Final Clinical Impressions(s) / UC Diagnoses   Final diagnoses:  Abscess     Discharge Instructions      You can continue to use warm compresses.  Take the antibiotics as directed.  Follow-up with your doctor in 1 week for wound check.    ED Prescriptions     Medication Sig Dispense Auth. Provider   doxycycline (VIBRAMYCIN) 100 MG capsule Take 1 capsule (100 mg total) by mouth 2 (two) times daily. 20 capsule Roxy Horseman, PA-C      PDMP not reviewed this encounter.   Roxy Horseman, PA-C 05/25/21 1541

## 2021-05-25 NOTE — Discharge Instructions (Signed)
You can continue to use warm compresses.  Take the antibiotics as directed.  Follow-up with your doctor in 1 week for wound check.

## 2021-06-04 ENCOUNTER — Other Ambulatory Visit: Payer: Self-pay

## 2021-06-04 ENCOUNTER — Ambulatory Visit (HOSPITAL_COMMUNITY)
Admission: EM | Admit: 2021-06-04 | Discharge: 2021-06-04 | Disposition: A | Discharge status: Home / Self Care | Payer: Medicare Other | Attending: Student | Admitting: Student

## 2021-06-04 ENCOUNTER — Encounter (HOSPITAL_COMMUNITY): Payer: Self-pay | Admitting: Emergency Medicine

## 2021-06-04 DIAGNOSIS — L02212 Cutaneous abscess of back [any part, except buttock]: Secondary | ICD-10-CM

## 2021-06-04 MED ORDER — SULFAMETHOXAZOLE-TRIMETHOPRIM 800-160 MG PO TABS: 1.0000 | ORAL_TABLET | Freq: Two times a day (BID) | ORAL | 0 refills | Status: AC

## 2021-06-04 MED ORDER — HIBICLENS 4 % EX LIQD: Freq: Every day | CUTANEOUS | 0 refills | Status: AC | PRN

## 2021-06-04 NOTE — ED Triage Notes (Signed)
Pt presents for follow-up after completing antibiotic for abscess on back. States had gotten better but still swollen and drainage.

## 2021-06-04 NOTE — Discharge Instructions (Addendum)
-  Start the antibiotic-Bactrim (trimethoprim-sulfamethoxazole) twice daily x7 days. Take with food if you have a sensitive stomach. -Hibiclens bodywash once daily while symptoms persist -You can also wash once daily with gentle soap and water and change the dressing -Follow-up with [place] or PCP in 7 days for recheck, or sooner if symptoms are getting worse (pain, drainage, fevers)

## 2021-06-04 NOTE — ED Provider Notes (Signed)
[place]    CSN: [id number] Arrival date & time: 06/04/21  1612      History   Chief Complaint Chief Complaint  Patient presents with   Follow-up    HPI Carol Ortiz is a 75 y.o. female presenting with continued back abscess.  We last saw her on 10/31 for the abscess, this has been present for some time.  This was drained appropriately and she was started on doxycycline which she completed 1 day ago with improvement in symptoms, however she states there is still drainage and pain.  Her daughter has been washing this with gentle soap and water 1-2 times daily and applying dressing.  Denies fever/chills.  She does have diabetes.  HPI  Past Medical History:  Diagnosis Date   Chest pain    normal echo 2012   Diabetes mellitus without complication ([place])    Hypertension     Patient Active Problem List   Diagnosis Date Noted   Abdominal pain 08/27/2020   Chest pain 12/23/2010   Tobacco abuse 12/23/2010   Hypertension 12/23/2010    Past Surgical History:  Procedure Laterality Date   CESAREAN SECTION      OB History   No obstetric history on file.      Home Medications    Prior to Admission medications   Medication Sig Start Date End Date Taking? Authorizing Provider  chlorhexidine (HIBICLENS) 4 % external liquid Apply topically daily as needed. Use as bodywash once daily while symptoms persist. Leave on for 5 minutes and then rinse off with water. 06/04/21  Yes Hazel Sams, PA-C  sulfamethoxazole-trimethoprim (BACTRIM DS) 800-160 MG tablet Take 1 tablet by mouth 2 (two) times daily for 7 days. 06/04/21 06/11/21 Yes Hazel Sams, PA-C  acetaminophen (TYLENOL) 500 MG tablet Take 1 tablet (500 mg total) by mouth every 6 (six) hours as needed for mild pain, fever or headache. 08/29/20   Norm Parcel, PA-C  amLODipine (NORVASC) 5 MG tablet TAKE 1 TABLET BY MOUTH EVERY DAY Patient taking differently: Take 5 mg by mouth daily. 02/06/18   Jaynee Eagles,  PA-C  methocarbamol (ROBAXIN) 500 MG tablet Take 1 tablet (500 mg total) by mouth 2 (two) times daily. 08/25/20   Jaynee Eagles, PA-C    Family History Family History  Problem Relation Age of Onset   Coronary artery disease Mother        Died at age 87 of MI   Coronary artery disease Father        Age 53    Social History Social History   Tobacco Use   Smoking status: Every Day    Types: Cigarettes   Smokeless tobacco: Never  Vaping Use   Vaping Use: Never used  Substance Use Topics   Alcohol use: No   Drug use: No     Allergies   Patient has no known allergies.   Review of Systems Review of Systems  Skin:  Positive for wound.  All other systems reviewed and are negative.   Physical Exam Triage Vital Signs ED Triage Vitals [06/04/21 1748]  Enc Vitals Group     BP 126/79     Pulse Rate 82     Resp 16     Temp 98.2 F (36.8 C)     Temp Source Oral     SpO2 97 %     Weight      Height      Head Circumference  Peak Flow      Pain Score 3     Pain Loc      Pain Edu?      Excl. in [place]?    No data found.  Updated Vital Signs BP 126/79 (BP Location: Left Arm)   Pulse 82   Temp 98.2 F (36.8 C) (Oral)   Resp 16   SpO2 97%   Visual Acuity Right Eye Distance:   Left Eye Distance:   Bilateral Distance:    Right Eye Near:   Left Eye Near:    Bilateral Near:     Physical Exam Vitals reviewed.  Constitutional:      General: She is not in acute distress.    Appearance: Normal appearance. She is not ill-appearing.  HENT:     Head: Normocephalic and atraumatic.  Pulmonary:     Effort: Pulmonary effort is normal.  Skin:    Comments: See image below R thoracic back with 2x3 area of induration and central fluctuance. There is spontaneous drainage (white)  Neurological:     General: No focal deficit present.     Mental Status: She is alert and oriented to person, place, and time.  Psychiatric:        Mood and Affect: Mood normal.         Behavior: Behavior normal.        Thought Content: Thought content normal.        Judgment: Judgment normal.      UC Treatments / Results  Labs (all labs ordered are listed, but only abnormal results are displayed) Labs Reviewed - No data to display  EKG   Radiology No results found.  Procedures Procedures (including critical care time)  Medications Ordered in UC Medications - No data to display  Initial Impression / Assessment and Plan / UC Course  I have reviewed the triage vital signs and the nursing notes.  Pertinent labs & imaging results that were available during my care of the patient were reviewed by me and considered in my medical decision making (see chart for details).     This patient is a very pleasant 75 y.o. year old female presenting with abscess - back. Afebrile, nontachy. This was drained 10/31 and she was started on doxycycline which she completed 11/9 as directed with some improvement. Will start her on bactrim and hibiclens wash. BMP was wnl 08/2020. F/u with [place] or PCP for wound check in 7 days, or sooner if symptoms worsen. She is a diabetic. ED return precautions discussed. Patient verbalizes understanding and agreement.  .   Final Clinical Impressions(s) / UC Diagnoses   Final diagnoses:  Abscess of back     Discharge Instructions      -Start the antibiotic-Bactrim (trimethoprim-sulfamethoxazole) twice daily x7 days. Take with food if you have a sensitive stomach. -Hibiclens bodywash once daily while symptoms persist -You can also wash once daily with gentle soap and water and change the dressing -Follow-up with [place] or PCP in 7 days for recheck, or sooner if symptoms are getting worse (pain, drainage, fevers)   ED Prescriptions     Medication Sig Dispense Auth. Provider   sulfamethoxazole-trimethoprim (BACTRIM DS) 800-160 MG tablet Take 1 tablet by mouth 2 (two) times daily for 7 days. 14 tablet Hazel Sams, PA-C   chlorhexidine  (HIBICLENS) 4 % external liquid Apply topically daily as needed. Use as bodywash once daily while symptoms persist. Leave on for 5 minutes and then rinse off with water.  120 mL Rhys Martini, PA-C      PDMP not reviewed this encounter.   Rhys Martini, PA-C 06/04/21 1816

## 2021-07-28 DIAGNOSIS — H40023 Open angle with borderline findings, high risk, bilateral: Secondary | ICD-10-CM | POA: Diagnosis not present

## 2021-07-28 DIAGNOSIS — H2512 Age-related nuclear cataract, left eye: Secondary | ICD-10-CM | POA: Diagnosis not present

## 2021-07-28 DIAGNOSIS — H25811 Combined forms of age-related cataract, right eye: Secondary | ICD-10-CM | POA: Diagnosis not present

## 2021-08-12 DIAGNOSIS — I10 Essential (primary) hypertension: Secondary | ICD-10-CM | POA: Diagnosis not present

## 2021-08-12 DIAGNOSIS — E785 Hyperlipidemia, unspecified: Secondary | ICD-10-CM | POA: Diagnosis not present

## 2021-08-12 DIAGNOSIS — E119 Type 2 diabetes mellitus without complications: Secondary | ICD-10-CM | POA: Diagnosis not present

## 2021-08-18 DIAGNOSIS — I10 Essential (primary) hypertension: Secondary | ICD-10-CM | POA: Diagnosis not present

## 2021-08-18 DIAGNOSIS — M5136 Other intervertebral disc degeneration, lumbar region: Secondary | ICD-10-CM | POA: Diagnosis not present

## 2021-08-18 DIAGNOSIS — E119 Type 2 diabetes mellitus without complications: Secondary | ICD-10-CM | POA: Diagnosis not present

## 2021-08-18 DIAGNOSIS — E785 Hyperlipidemia, unspecified: Secondary | ICD-10-CM | POA: Diagnosis not present

## 2021-08-18 DIAGNOSIS — R946 Abnormal results of thyroid function studies: Secondary | ICD-10-CM | POA: Diagnosis not present

## 2021-08-18 DIAGNOSIS — N644 Mastodynia: Secondary | ICD-10-CM | POA: Diagnosis not present

## 2021-08-19 ENCOUNTER — Other Ambulatory Visit: Payer: Self-pay | Admitting: Family Medicine

## 2021-08-19 DIAGNOSIS — N644 Mastodynia: Secondary | ICD-10-CM

## 2021-08-21 ENCOUNTER — Other Ambulatory Visit: Payer: Medicare Other

## 2021-09-10 ENCOUNTER — Other Ambulatory Visit: Payer: Self-pay | Admitting: Family Medicine

## 2021-09-10 ENCOUNTER — Ambulatory Visit
Admission: RE | Admit: 2021-09-10 | Discharge: 2021-09-10 | Disposition: A | Discharge status: Home / Self Care | Payer: Medicare Other | Source: Ambulatory Visit | Attending: Family Medicine | Admitting: Family Medicine

## 2021-09-10 DIAGNOSIS — R922 Inconclusive mammogram: Secondary | ICD-10-CM | POA: Diagnosis not present

## 2021-09-10 DIAGNOSIS — N6310 Unspecified lump in the right breast, unspecified quadrant: Secondary | ICD-10-CM

## 2021-09-10 DIAGNOSIS — N644 Mastodynia: Secondary | ICD-10-CM

## 2021-10-13 DIAGNOSIS — L732 Hidradenitis suppurativa: Secondary | ICD-10-CM | POA: Diagnosis not present

## 2021-10-13 DIAGNOSIS — L700 Acne vulgaris: Secondary | ICD-10-CM | POA: Diagnosis not present

## 2021-12-09 DIAGNOSIS — E785 Hyperlipidemia, unspecified: Secondary | ICD-10-CM | POA: Diagnosis not present

## 2021-12-09 DIAGNOSIS — E119 Type 2 diabetes mellitus without complications: Secondary | ICD-10-CM | POA: Diagnosis not present

## 2021-12-09 DIAGNOSIS — R946 Abnormal results of thyroid function studies: Secondary | ICD-10-CM | POA: Diagnosis not present

## 2021-12-09 DIAGNOSIS — I10 Essential (primary) hypertension: Secondary | ICD-10-CM | POA: Diagnosis not present

## 2021-12-16 DIAGNOSIS — L723 Sebaceous cyst: Secondary | ICD-10-CM | POA: Diagnosis not present

## 2021-12-16 DIAGNOSIS — Z5320 Procedure and treatment not carried out because of patient's decision for unspecified reasons: Secondary | ICD-10-CM | POA: Diagnosis not present

## 2021-12-16 DIAGNOSIS — E785 Hyperlipidemia, unspecified: Secondary | ICD-10-CM | POA: Diagnosis not present

## 2021-12-16 DIAGNOSIS — R599 Enlarged lymph nodes, unspecified: Secondary | ICD-10-CM | POA: Diagnosis not present

## 2021-12-16 DIAGNOSIS — I10 Essential (primary) hypertension: Secondary | ICD-10-CM | POA: Diagnosis not present

## 2021-12-16 DIAGNOSIS — E118 Type 2 diabetes mellitus with unspecified complications: Secondary | ICD-10-CM | POA: Diagnosis not present

## 2021-12-16 DIAGNOSIS — Z0000 Encounter for general adult medical examination without abnormal findings: Secondary | ICD-10-CM | POA: Diagnosis not present

## 2021-12-16 DIAGNOSIS — M79622 Pain in left upper arm: Secondary | ICD-10-CM | POA: Diagnosis not present

## 2021-12-22 ENCOUNTER — Other Ambulatory Visit: Payer: Self-pay | Admitting: Family Medicine

## 2021-12-22 DIAGNOSIS — R599 Enlarged lymph nodes, unspecified: Secondary | ICD-10-CM

## 2021-12-22 DIAGNOSIS — M79622 Pain in left upper arm: Secondary | ICD-10-CM

## 2021-12-23 ENCOUNTER — Other Ambulatory Visit: Payer: Self-pay | Admitting: Family Medicine

## 2021-12-23 DIAGNOSIS — N8500 Endometrial hyperplasia, unspecified: Secondary | ICD-10-CM

## 2021-12-25 ENCOUNTER — Other Ambulatory Visit: Payer: Self-pay | Admitting: Family Medicine

## 2021-12-25 DIAGNOSIS — M79622 Pain in left upper arm: Secondary | ICD-10-CM

## 2021-12-25 DIAGNOSIS — R599 Enlarged lymph nodes, unspecified: Secondary | ICD-10-CM

## 2021-12-29 ENCOUNTER — Ambulatory Visit
Admission: RE | Admit: 2021-12-29 | Discharge: 2021-12-29 | Disposition: A | Discharge status: Home / Self Care | Payer: Medicare Other | Source: Ambulatory Visit | Attending: Family Medicine | Admitting: Family Medicine

## 2021-12-29 DIAGNOSIS — N8500 Endometrial hyperplasia, unspecified: Secondary | ICD-10-CM

## 2021-12-29 DIAGNOSIS — R9389 Abnormal findings on diagnostic imaging of other specified body structures: Secondary | ICD-10-CM | POA: Diagnosis not present

## 2022-01-08 ENCOUNTER — Ambulatory Visit
Admission: RE | Admit: 2022-01-08 | Discharge: 2022-01-08 | Disposition: A | Discharge status: Home / Self Care | Payer: Medicare Other | Source: Ambulatory Visit | Attending: Family Medicine | Admitting: Family Medicine

## 2022-01-08 DIAGNOSIS — R599 Enlarged lymph nodes, unspecified: Secondary | ICD-10-CM

## 2022-01-08 DIAGNOSIS — N644 Mastodynia: Secondary | ICD-10-CM | POA: Diagnosis not present

## 2022-01-08 DIAGNOSIS — M79622 Pain in left upper arm: Secondary | ICD-10-CM

## 2022-01-27 DIAGNOSIS — H40023 Open angle with borderline findings, high risk, bilateral: Secondary | ICD-10-CM | POA: Diagnosis not present

## 2022-01-27 DIAGNOSIS — H2512 Age-related nuclear cataract, left eye: Secondary | ICD-10-CM | POA: Diagnosis not present

## 2022-01-27 DIAGNOSIS — H25811 Combined forms of age-related cataract, right eye: Secondary | ICD-10-CM | POA: Diagnosis not present

## 2022-06-16 DIAGNOSIS — Z5320 Procedure and treatment not carried out because of patient's decision for unspecified reasons: Secondary | ICD-10-CM | POA: Diagnosis not present

## 2022-06-16 DIAGNOSIS — E785 Hyperlipidemia, unspecified: Secondary | ICD-10-CM | POA: Diagnosis not present

## 2022-06-16 DIAGNOSIS — E118 Type 2 diabetes mellitus with unspecified complications: Secondary | ICD-10-CM | POA: Diagnosis not present

## 2022-06-16 DIAGNOSIS — R946 Abnormal results of thyroid function studies: Secondary | ICD-10-CM | POA: Diagnosis not present

## 2022-06-16 DIAGNOSIS — Z79899 Other long term (current) drug therapy: Secondary | ICD-10-CM | POA: Diagnosis not present

## 2022-06-16 DIAGNOSIS — I10 Essential (primary) hypertension: Secondary | ICD-10-CM | POA: Diagnosis not present

## 2022-06-23 DIAGNOSIS — M25562 Pain in left knee: Secondary | ICD-10-CM | POA: Diagnosis not present

## 2022-06-23 DIAGNOSIS — M1712 Unilateral primary osteoarthritis, left knee: Secondary | ICD-10-CM | POA: Diagnosis not present

## 2022-06-30 DIAGNOSIS — R9389 Abnormal findings on diagnostic imaging of other specified body structures: Secondary | ICD-10-CM | POA: Diagnosis not present

## 2022-07-16 DIAGNOSIS — R9389 Abnormal findings on diagnostic imaging of other specified body structures: Secondary | ICD-10-CM | POA: Diagnosis not present

## 2022-07-23 DIAGNOSIS — M170 Bilateral primary osteoarthritis of knee: Secondary | ICD-10-CM | POA: Diagnosis not present

## 2022-07-23 IMAGING — MG MM DIGITAL DIAGNOSTIC UNILAT*L* W/ TOMO W/ CAD
6 of 9 series · 6 of 21 positions shown · non-contrast
Comparison: Previous exam(s).

CLINICAL DATA: Focal pain left breast and left axilla.

EXAM:
DIGITAL DIAGNOSTIC UNILATERAL LEFT MAMMOGRAM WITH TOMOSYNTHESIS AND
CAD; ULTRASOUND LEFT BREAST LIMITED
TECHNIQUE: Left digital diagnostic mammography and breast tomosynthesis was
performed. The images were evaluated with computer-aided detection.;
Targeted ultrasound examination of the left breast was performed.

[L ML (1 of 3)]
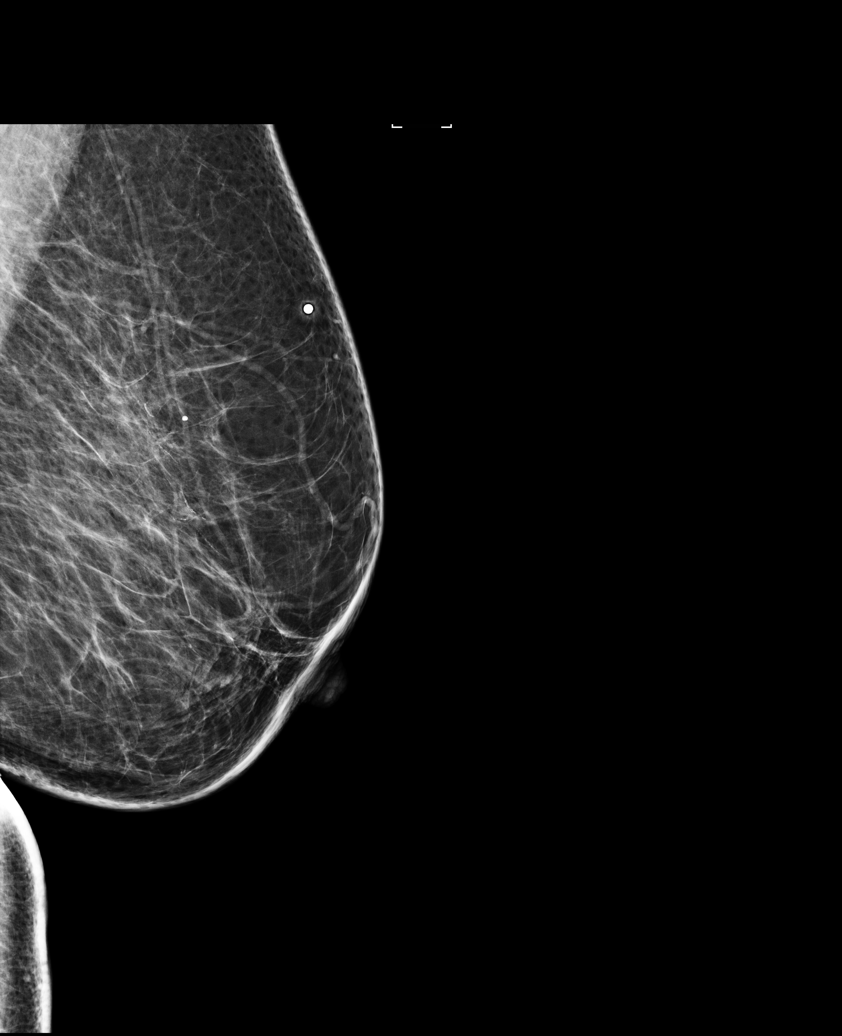

[L ML (2 of 3)]
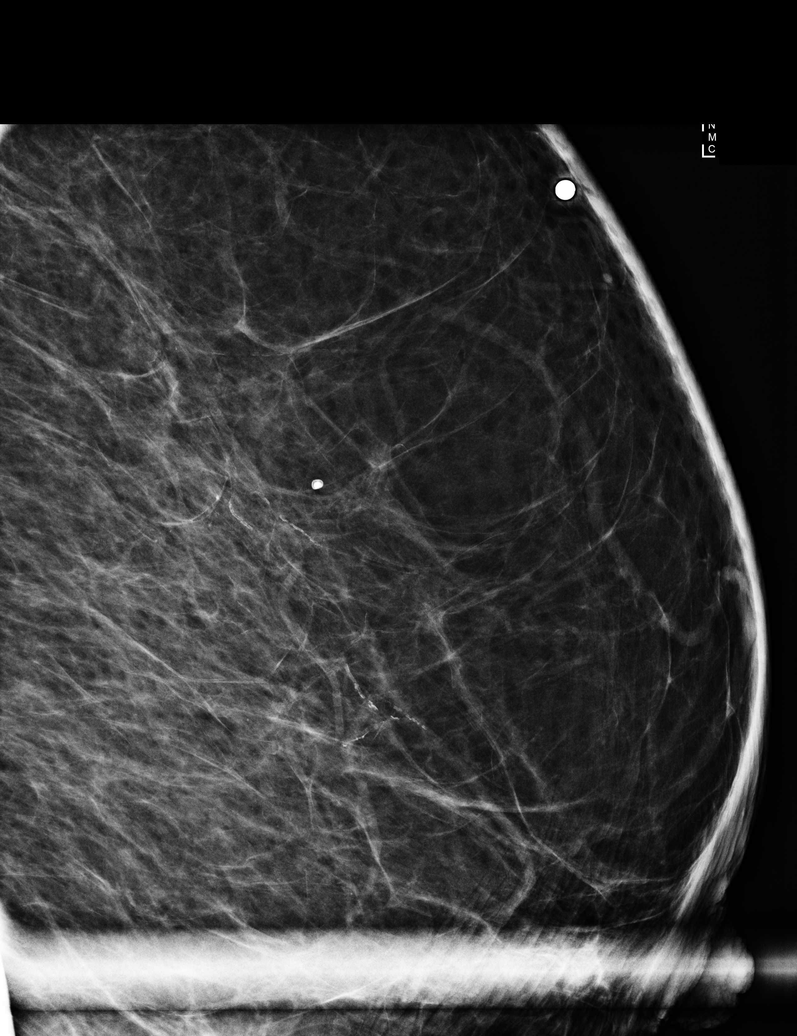

[L ML (3 of 3)]
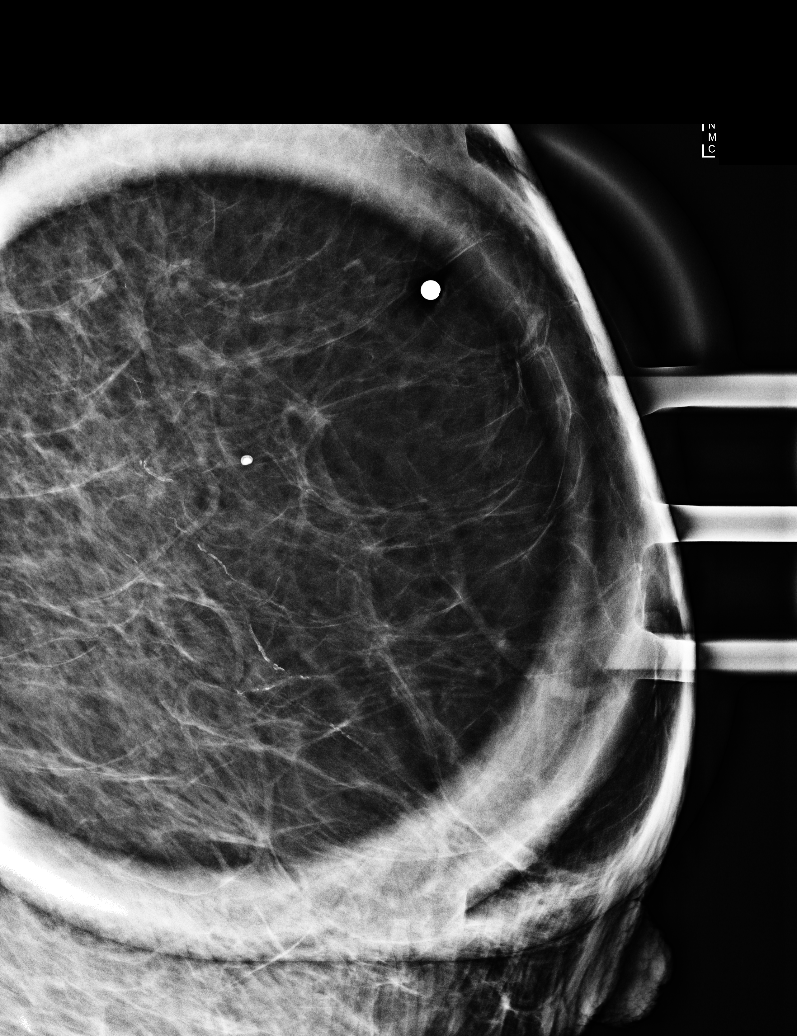

[L CC synth-2D]
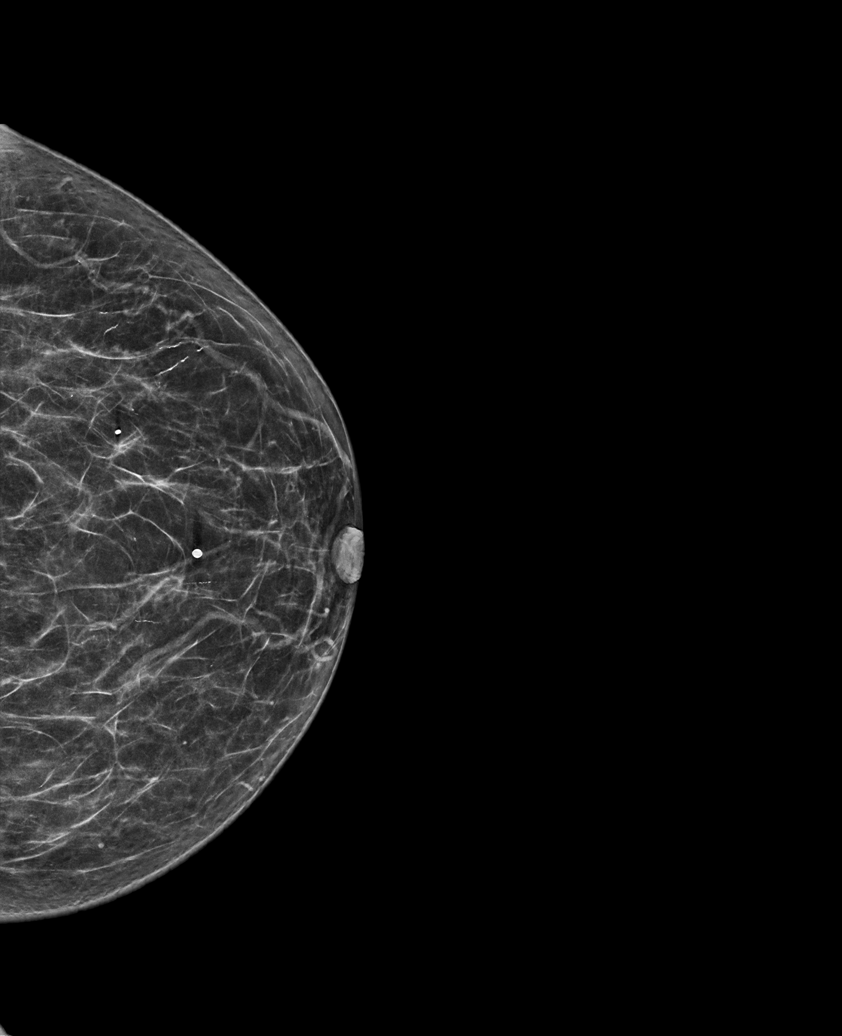

[L TAN synth-2D]
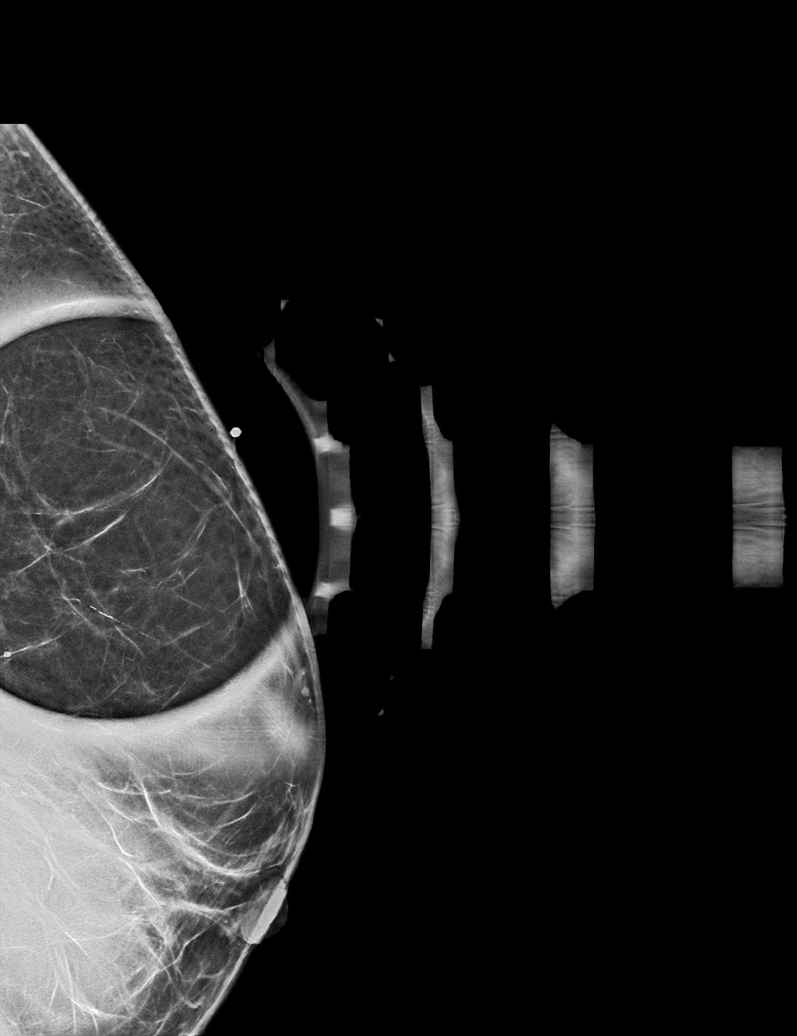

[L MLO synth-2D]
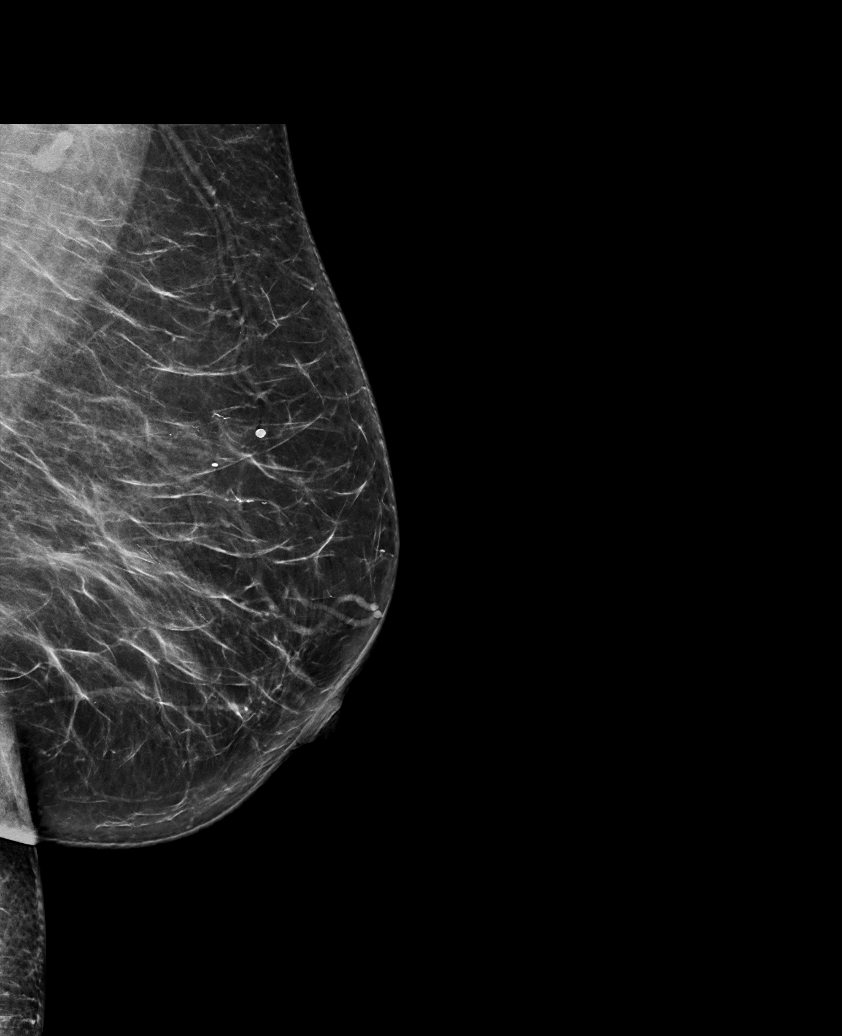

[6 of 21 positions shown; findings below may reference images not displayed]

ACR Breast Density Category b: There are scattered areas of
fibroglandular density.
FINDINGS: Cc and MLO views of the left breast, spot magnification left lateral
view and lateral view of the left breast are submitted. There are
questioned calcifications in the upper left breast which do not
persist on the spot magnification views. No suspicious abnormalities
identified.

Targeted ultrasound is performed, showing no focal abnormal discrete
cystic or solid lesion in focal areas of pain left axilla and left
breast 12 o'clock.
IMPRESSION: Benign findings.

RECOMMENDATION:
Routine screening mammogram back on schedule.

I have discussed the findings and recommendations with the patient.
If applicable, a reminder letter will be sent to the patient
regarding the next appointment.

BI-RADS CATEGORY  2: Benign.

## 2022-07-27 DIAGNOSIS — E119 Type 2 diabetes mellitus without complications: Secondary | ICD-10-CM | POA: Diagnosis not present

## 2022-07-27 DIAGNOSIS — I10 Essential (primary) hypertension: Secondary | ICD-10-CM | POA: Diagnosis not present

## 2022-07-27 DIAGNOSIS — H2512 Age-related nuclear cataract, left eye: Secondary | ICD-10-CM | POA: Diagnosis not present

## 2022-07-27 DIAGNOSIS — H40023 Open angle with borderline findings, high risk, bilateral: Secondary | ICD-10-CM | POA: Diagnosis not present

## 2022-07-27 DIAGNOSIS — H25811 Combined forms of age-related cataract, right eye: Secondary | ICD-10-CM | POA: Diagnosis not present

## 2022-08-05 DIAGNOSIS — E119 Type 2 diabetes mellitus without complications: Secondary | ICD-10-CM | POA: Diagnosis not present

## 2022-08-05 DIAGNOSIS — I10 Essential (primary) hypertension: Secondary | ICD-10-CM | POA: Diagnosis not present

## 2022-08-19 DIAGNOSIS — I10 Essential (primary) hypertension: Secondary | ICD-10-CM | POA: Diagnosis not present

## 2022-08-19 DIAGNOSIS — E119 Type 2 diabetes mellitus without complications: Secondary | ICD-10-CM | POA: Diagnosis not present

## 2022-09-05 DIAGNOSIS — I10 Essential (primary) hypertension: Secondary | ICD-10-CM | POA: Diagnosis not present

## 2022-09-05 DIAGNOSIS — E119 Type 2 diabetes mellitus without complications: Secondary | ICD-10-CM | POA: Diagnosis not present

## 2022-10-04 DIAGNOSIS — I10 Essential (primary) hypertension: Secondary | ICD-10-CM | POA: Diagnosis not present

## 2022-10-04 DIAGNOSIS — E119 Type 2 diabetes mellitus without complications: Secondary | ICD-10-CM | POA: Diagnosis not present

## 2022-10-05 DIAGNOSIS — I10 Essential (primary) hypertension: Secondary | ICD-10-CM | POA: Diagnosis not present

## 2022-10-05 DIAGNOSIS — E119 Type 2 diabetes mellitus without complications: Secondary | ICD-10-CM | POA: Diagnosis not present

## 2022-10-15 DIAGNOSIS — R946 Abnormal results of thyroid function studies: Secondary | ICD-10-CM | POA: Diagnosis not present

## 2022-10-15 DIAGNOSIS — Z79899 Other long term (current) drug therapy: Secondary | ICD-10-CM | POA: Diagnosis not present

## 2022-10-15 DIAGNOSIS — E785 Hyperlipidemia, unspecified: Secondary | ICD-10-CM | POA: Diagnosis not present

## 2022-10-15 DIAGNOSIS — Z5320 Procedure and treatment not carried out because of patient's decision for unspecified reasons: Secondary | ICD-10-CM | POA: Diagnosis not present

## 2022-10-15 DIAGNOSIS — I10 Essential (primary) hypertension: Secondary | ICD-10-CM | POA: Diagnosis not present

## 2022-10-15 DIAGNOSIS — E118 Type 2 diabetes mellitus with unspecified complications: Secondary | ICD-10-CM | POA: Diagnosis not present

## 2022-10-24 DIAGNOSIS — I10 Essential (primary) hypertension: Secondary | ICD-10-CM | POA: Diagnosis not present

## 2022-10-24 DIAGNOSIS — E119 Type 2 diabetes mellitus without complications: Secondary | ICD-10-CM | POA: Diagnosis not present

## 2022-10-26 DIAGNOSIS — E785 Hyperlipidemia, unspecified: Secondary | ICD-10-CM | POA: Diagnosis not present

## 2022-10-26 DIAGNOSIS — L723 Sebaceous cyst: Secondary | ICD-10-CM | POA: Diagnosis not present

## 2022-10-26 DIAGNOSIS — E118 Type 2 diabetes mellitus with unspecified complications: Secondary | ICD-10-CM | POA: Diagnosis not present

## 2022-10-26 DIAGNOSIS — I10 Essential (primary) hypertension: Secondary | ICD-10-CM | POA: Diagnosis not present

## 2022-10-26 DIAGNOSIS — R059 Cough, unspecified: Secondary | ICD-10-CM | POA: Diagnosis not present

## 2022-10-26 DIAGNOSIS — M1710 Unilateral primary osteoarthritis, unspecified knee: Secondary | ICD-10-CM | POA: Diagnosis not present

## 2022-10-26 DIAGNOSIS — M25552 Pain in left hip: Secondary | ICD-10-CM | POA: Diagnosis not present

## 2022-10-26 DIAGNOSIS — M5136 Other intervertebral disc degeneration, lumbar region: Secondary | ICD-10-CM | POA: Diagnosis not present

## 2022-11-04 DIAGNOSIS — I10 Essential (primary) hypertension: Secondary | ICD-10-CM | POA: Diagnosis not present

## 2022-11-04 DIAGNOSIS — E119 Type 2 diabetes mellitus without complications: Secondary | ICD-10-CM | POA: Diagnosis not present

## 2022-12-04 DIAGNOSIS — E119 Type 2 diabetes mellitus without complications: Secondary | ICD-10-CM | POA: Diagnosis not present

## 2022-12-04 DIAGNOSIS — I10 Essential (primary) hypertension: Secondary | ICD-10-CM | POA: Diagnosis not present

## 2022-12-24 DIAGNOSIS — E119 Type 2 diabetes mellitus without complications: Secondary | ICD-10-CM | POA: Diagnosis not present

## 2022-12-24 DIAGNOSIS — I10 Essential (primary) hypertension: Secondary | ICD-10-CM | POA: Diagnosis not present

## 2023-01-03 DIAGNOSIS — E119 Type 2 diabetes mellitus without complications: Secondary | ICD-10-CM | POA: Diagnosis not present

## 2023-01-03 DIAGNOSIS — I10 Essential (primary) hypertension: Secondary | ICD-10-CM | POA: Diagnosis not present

## 2023-02-02 DIAGNOSIS — E119 Type 2 diabetes mellitus without complications: Secondary | ICD-10-CM | POA: Diagnosis not present

## 2023-02-02 DIAGNOSIS — I10 Essential (primary) hypertension: Secondary | ICD-10-CM | POA: Diagnosis not present

## 2023-02-23 DIAGNOSIS — E119 Type 2 diabetes mellitus without complications: Secondary | ICD-10-CM | POA: Diagnosis not present

## 2023-02-23 DIAGNOSIS — I10 Essential (primary) hypertension: Secondary | ICD-10-CM | POA: Diagnosis not present

## 2023-03-04 DIAGNOSIS — I10 Essential (primary) hypertension: Secondary | ICD-10-CM | POA: Diagnosis not present

## 2023-03-04 DIAGNOSIS — E119 Type 2 diabetes mellitus without complications: Secondary | ICD-10-CM | POA: Diagnosis not present

## 2023-03-15 DIAGNOSIS — I10 Essential (primary) hypertension: Secondary | ICD-10-CM | POA: Diagnosis not present

## 2023-03-15 DIAGNOSIS — E119 Type 2 diabetes mellitus without complications: Secondary | ICD-10-CM | POA: Diagnosis not present

## 2023-03-21 DIAGNOSIS — E118 Type 2 diabetes mellitus with unspecified complications: Secondary | ICD-10-CM | POA: Diagnosis not present

## 2023-03-21 DIAGNOSIS — E785 Hyperlipidemia, unspecified: Secondary | ICD-10-CM | POA: Diagnosis not present

## 2023-03-21 DIAGNOSIS — I10 Essential (primary) hypertension: Secondary | ICD-10-CM | POA: Diagnosis not present

## 2023-03-26 DIAGNOSIS — I10 Essential (primary) hypertension: Secondary | ICD-10-CM | POA: Diagnosis not present

## 2023-03-26 DIAGNOSIS — E119 Type 2 diabetes mellitus without complications: Secondary | ICD-10-CM | POA: Diagnosis not present

## 2023-03-30 DIAGNOSIS — M5136 Other intervertebral disc degeneration, lumbar region: Secondary | ICD-10-CM | POA: Diagnosis not present

## 2023-03-30 DIAGNOSIS — Z79899 Other long term (current) drug therapy: Secondary | ICD-10-CM | POA: Diagnosis not present

## 2023-03-30 DIAGNOSIS — M1710 Unilateral primary osteoarthritis, unspecified knee: Secondary | ICD-10-CM | POA: Diagnosis not present

## 2023-03-30 DIAGNOSIS — L0292 Furuncle, unspecified: Secondary | ICD-10-CM | POA: Diagnosis not present

## 2023-03-30 DIAGNOSIS — E785 Hyperlipidemia, unspecified: Secondary | ICD-10-CM | POA: Diagnosis not present

## 2023-03-30 DIAGNOSIS — E118 Type 2 diabetes mellitus with unspecified complications: Secondary | ICD-10-CM | POA: Diagnosis not present

## 2023-03-30 DIAGNOSIS — L723 Sebaceous cyst: Secondary | ICD-10-CM | POA: Diagnosis not present

## 2023-03-30 DIAGNOSIS — Z0000 Encounter for general adult medical examination without abnormal findings: Secondary | ICD-10-CM | POA: Diagnosis not present

## 2023-03-30 DIAGNOSIS — H6120 Impacted cerumen, unspecified ear: Secondary | ICD-10-CM | POA: Diagnosis not present

## 2023-04-03 DIAGNOSIS — I10 Essential (primary) hypertension: Secondary | ICD-10-CM | POA: Diagnosis not present

## 2023-04-03 DIAGNOSIS — E119 Type 2 diabetes mellitus without complications: Secondary | ICD-10-CM | POA: Diagnosis not present

## 2023-07-13 DIAGNOSIS — H25811 Combined forms of age-related cataract, right eye: Secondary | ICD-10-CM | POA: Diagnosis not present

## 2023-07-13 DIAGNOSIS — H2512 Age-related nuclear cataract, left eye: Secondary | ICD-10-CM | POA: Diagnosis not present

## 2023-07-13 DIAGNOSIS — H40023 Open angle with borderline findings, high risk, bilateral: Secondary | ICD-10-CM | POA: Diagnosis not present

## 2023-07-13 DIAGNOSIS — E119 Type 2 diabetes mellitus without complications: Secondary | ICD-10-CM | POA: Diagnosis not present

## 2023-07-28 DIAGNOSIS — Z79899 Other long term (current) drug therapy: Secondary | ICD-10-CM | POA: Diagnosis not present

## 2023-07-28 DIAGNOSIS — E118 Type 2 diabetes mellitus with unspecified complications: Secondary | ICD-10-CM | POA: Diagnosis not present

## 2023-07-28 DIAGNOSIS — E785 Hyperlipidemia, unspecified: Secondary | ICD-10-CM | POA: Diagnosis not present

## 2023-08-04 DIAGNOSIS — R946 Abnormal results of thyroid function studies: Secondary | ICD-10-CM | POA: Diagnosis not present

## 2023-08-04 DIAGNOSIS — I10 Essential (primary) hypertension: Secondary | ICD-10-CM | POA: Diagnosis not present

## 2023-08-04 DIAGNOSIS — L0292 Furuncle, unspecified: Secondary | ICD-10-CM | POA: Diagnosis not present

## 2023-08-04 DIAGNOSIS — E118 Type 2 diabetes mellitus with unspecified complications: Secondary | ICD-10-CM | POA: Diagnosis not present

## 2024-01-26 DIAGNOSIS — E118 Type 2 diabetes mellitus with unspecified complications: Secondary | ICD-10-CM | POA: Diagnosis not present

## 2024-01-26 DIAGNOSIS — I10 Essential (primary) hypertension: Secondary | ICD-10-CM | POA: Diagnosis not present

## 2024-01-26 DIAGNOSIS — R946 Abnormal results of thyroid function studies: Secondary | ICD-10-CM | POA: Diagnosis not present

## 2024-02-02 DIAGNOSIS — G720 Drug-induced myopathy: Secondary | ICD-10-CM | POA: Diagnosis not present

## 2024-02-02 DIAGNOSIS — M5136 Other intervertebral disc degeneration, lumbar region with discogenic back pain only: Secondary | ICD-10-CM | POA: Diagnosis not present

## 2024-02-02 DIAGNOSIS — Z79899 Other long term (current) drug therapy: Secondary | ICD-10-CM | POA: Diagnosis not present

## 2024-02-02 DIAGNOSIS — E118 Type 2 diabetes mellitus with unspecified complications: Secondary | ICD-10-CM | POA: Diagnosis not present

## 2024-02-02 DIAGNOSIS — R946 Abnormal results of thyroid function studies: Secondary | ICD-10-CM | POA: Diagnosis not present

## 2024-02-02 DIAGNOSIS — E7800 Pure hypercholesterolemia, unspecified: Secondary | ICD-10-CM | POA: Diagnosis not present

## 2024-02-02 DIAGNOSIS — I10 Essential (primary) hypertension: Secondary | ICD-10-CM | POA: Diagnosis not present

## 2024-03-06 DIAGNOSIS — Z1212 Encounter for screening for malignant neoplasm of rectum: Secondary | ICD-10-CM | POA: Diagnosis not present

## 2024-03-06 DIAGNOSIS — Z1211 Encounter for screening for malignant neoplasm of colon: Secondary | ICD-10-CM | POA: Diagnosis not present

## 2024-03-14 LAB — COLOGUARD: COLOGUARD: NEGATIVE

## 2024-04-03 DIAGNOSIS — R109 Unspecified abdominal pain: Secondary | ICD-10-CM | POA: Diagnosis not present

## 2024-04-03 DIAGNOSIS — Z1211 Encounter for screening for malignant neoplasm of colon: Secondary | ICD-10-CM | POA: Diagnosis not present

## 2024-04-03 DIAGNOSIS — K5900 Constipation, unspecified: Secondary | ICD-10-CM | POA: Diagnosis not present

## 2024-04-04 DIAGNOSIS — R109 Unspecified abdominal pain: Secondary | ICD-10-CM | POA: Diagnosis not present

## 2024-04-25 DIAGNOSIS — H18513 Endothelial corneal dystrophy, bilateral: Secondary | ICD-10-CM | POA: Diagnosis not present

## 2024-04-25 DIAGNOSIS — E119 Type 2 diabetes mellitus without complications: Secondary | ICD-10-CM | POA: Diagnosis not present

## 2024-04-25 DIAGNOSIS — H40023 Open angle with borderline findings, high risk, bilateral: Secondary | ICD-10-CM | POA: Diagnosis not present

## 2024-04-25 DIAGNOSIS — H2513 Age-related nuclear cataract, bilateral: Secondary | ICD-10-CM | POA: Diagnosis not present

## 2024-04-30 DIAGNOSIS — Z79899 Other long term (current) drug therapy: Secondary | ICD-10-CM | POA: Diagnosis not present

## 2024-04-30 DIAGNOSIS — I10 Essential (primary) hypertension: Secondary | ICD-10-CM | POA: Diagnosis not present

## 2024-04-30 DIAGNOSIS — E118 Type 2 diabetes mellitus with unspecified complications: Secondary | ICD-10-CM | POA: Diagnosis not present

## 2024-04-30 DIAGNOSIS — E7800 Pure hypercholesterolemia, unspecified: Secondary | ICD-10-CM | POA: Diagnosis not present

## 2024-05-03 DIAGNOSIS — Z0000 Encounter for general adult medical examination without abnormal findings: Secondary | ICD-10-CM | POA: Diagnosis not present

## 2024-05-03 DIAGNOSIS — E118 Type 2 diabetes mellitus with unspecified complications: Secondary | ICD-10-CM | POA: Diagnosis not present

## 2024-05-03 DIAGNOSIS — I10 Essential (primary) hypertension: Secondary | ICD-10-CM | POA: Diagnosis not present

## 2024-05-03 DIAGNOSIS — K5900 Constipation, unspecified: Secondary | ICD-10-CM | POA: Diagnosis not present

## 2024-05-03 DIAGNOSIS — E785 Hyperlipidemia, unspecified: Secondary | ICD-10-CM | POA: Diagnosis not present

## 2024-05-12 DIAGNOSIS — K5900 Constipation, unspecified: Secondary | ICD-10-CM | POA: Diagnosis not present

## 2024-06-12 ENCOUNTER — Other Ambulatory Visit: Payer: Self-pay | Admitting: Gastroenterology

## 2024-06-12 DIAGNOSIS — R634 Abnormal weight loss: Secondary | ICD-10-CM

## 2024-06-12 DIAGNOSIS — K5900 Constipation, unspecified: Secondary | ICD-10-CM | POA: Diagnosis not present

## 2024-06-29 ENCOUNTER — Inpatient Hospital Stay: Admission: RE | Admit: 2024-06-29

## 2024-07-06 ENCOUNTER — Inpatient Hospital Stay: Admission: RE | Admit: 2024-07-06 | Discharge: 2024-07-06 | Attending: Gastroenterology | Admitting: Gastroenterology

## 2024-07-06 DIAGNOSIS — R634 Abnormal weight loss: Secondary | ICD-10-CM

## 2024-07-06 MED ORDER — IOPAMIDOL (ISOVUE-300) INJECTION 61%
100.0000 mL | Freq: Once | INTRAVENOUS | Status: AC | PRN
  Administered 2024-07-06: 15:00:00 100 mL via INTRAVENOUS
